# Patient Record
Sex: Female | Born: 1980 | Race: White | Hispanic: No | Marital: Married
Health system: Southern US, Community
[De-identification: ages and names within clinical notes are randomized; demographics above are authoritative.]

## PROBLEM LIST (undated history)

## (undated) DIAGNOSIS — N289 Disorder of kidney and ureter, unspecified: Secondary | ICD-10-CM

## (undated) DIAGNOSIS — E7800 Pure hypercholesterolemia, unspecified: Secondary | ICD-10-CM

## (undated) DIAGNOSIS — I10 Essential (primary) hypertension: Secondary | ICD-10-CM

## (undated) DIAGNOSIS — G43909 Migraine, unspecified, not intractable, without status migrainosus: Secondary | ICD-10-CM

## (undated) DIAGNOSIS — E039 Hypothyroidism, unspecified: Secondary | ICD-10-CM

## (undated) DIAGNOSIS — J45909 Unspecified asthma, uncomplicated: Secondary | ICD-10-CM

## (undated) DIAGNOSIS — J40 Bronchitis, not specified as acute or chronic: Secondary | ICD-10-CM

## (undated) DIAGNOSIS — F319 Bipolar disorder, unspecified: Secondary | ICD-10-CM

## (undated) DIAGNOSIS — G40909 Epilepsy, unspecified, not intractable, without status epilepticus: Secondary | ICD-10-CM

## (undated) HISTORY — PX: CHOLECYSTECTOMY: SHX55

## (undated) HISTORY — PX: NEPHRECTOMY: SHX65

## (undated) HISTORY — PX: ABDOMINAL HYSTERECTOMY: SHX81

---

## 2005-03-21 ENCOUNTER — Emergency Department (HOSPITAL_COMMUNITY): Admission: EM | Admit: 2005-03-21 | Discharge: 2005-03-21 | Payer: Self-pay | Admitting: Family Medicine

## 2005-03-21 ENCOUNTER — Ambulatory Visit (HOSPITAL_COMMUNITY): Admission: RE | Admit: 2005-03-21 | Discharge: 2005-03-21 | Payer: Self-pay | Admitting: Family Medicine

## 2008-07-26 ENCOUNTER — Emergency Department (HOSPITAL_COMMUNITY): Admission: EM | Admit: 2008-07-26 | Discharge: 2008-07-26 | Payer: Self-pay | Admitting: Family Medicine

## 2017-07-09 DIAGNOSIS — Z79899 Other long term (current) drug therapy: Secondary | ICD-10-CM | POA: Insufficient documentation

## 2017-07-09 DIAGNOSIS — G8929 Other chronic pain: Secondary | ICD-10-CM | POA: Insufficient documentation

## 2017-07-09 DIAGNOSIS — F319 Bipolar disorder, unspecified: Secondary | ICD-10-CM | POA: Insufficient documentation

## 2018-05-25 ENCOUNTER — Emergency Department
Admission: EM | Admit: 2018-05-25 | Discharge: 2018-05-25 | Disposition: A | Discharge status: Home / Self Care | Payer: 59 | Attending: Emergency Medicine | Admitting: Emergency Medicine

## 2018-05-25 ENCOUNTER — Other Ambulatory Visit: Payer: Self-pay

## 2018-05-25 ENCOUNTER — Encounter: Payer: Self-pay | Admitting: Emergency Medicine

## 2018-05-25 DIAGNOSIS — R21 Rash and other nonspecific skin eruption: Secondary | ICD-10-CM | POA: Diagnosis present

## 2018-05-25 DIAGNOSIS — Y929 Unspecified place or not applicable: Secondary | ICD-10-CM | POA: Diagnosis not present

## 2018-05-25 DIAGNOSIS — S40862A Insect bite (nonvenomous) of left upper arm, initial encounter: Secondary | ICD-10-CM | POA: Diagnosis not present

## 2018-05-25 DIAGNOSIS — Y998 Other external cause status: Secondary | ICD-10-CM | POA: Diagnosis not present

## 2018-05-25 DIAGNOSIS — Y939 Activity, unspecified: Secondary | ICD-10-CM | POA: Insufficient documentation

## 2018-05-25 DIAGNOSIS — S40861A Insect bite (nonvenomous) of right upper arm, initial encounter: Secondary | ICD-10-CM | POA: Diagnosis not present

## 2018-05-25 DIAGNOSIS — W57XXXA Bitten or stung by nonvenomous insect and other nonvenomous arthropods, initial encounter: Secondary | ICD-10-CM | POA: Diagnosis not present

## 2018-05-25 MED ORDER — DIPHENHYDRAMINE HCL 25 MG PO CAPS: 25.0000 mg | ORAL_CAPSULE | Freq: Once | ORAL | Status: DC

## 2018-05-25 MED ORDER — FAMOTIDINE 20 MG PO TABS
40.0000 mg | ORAL_TABLET | Freq: Once | ORAL | Status: AC
  Administered 2018-05-25: 40 mg via ORAL
  Filled 2018-05-25: qty 2

## 2018-05-25 NOTE — ED Triage Notes (Signed)
Pt to ED via POV c/o rash on both arms since Thursday afternoon. Pt states that the rash itches. Pt is in NAD at this time.

## 2018-05-25 NOTE — Discharge Instructions (Addendum)
Your rash appears to be bites from bed bugs. Take OTC Benadryl and Zantac or Pepcid, for itch relief. Check you mattress for signs of infestation. You must use products specifically for bedbugs to rid your home and furniture of insects.

## 2018-05-26 NOTE — ED Provider Notes (Signed)
[facility] Emergency Department Provider Note ____________________________________________  Time seen: 1945  I have reviewed the triage vital signs and the nursing notes.  HISTORY  Chief Complaint  Rash  HPI Brianna [name] is a 37 y.o. female resents to the ED accompanied by her fianc, for evaluation of itchy rash noted to the forearms.  Patient is unaware of any known exposures or contacts.  She does admit that she recently moved in with her boyfriend and has been seen in 1 of the bedrooms.  Patient does have pets that are in the home but denies any flea infestation.  Patient denies any known insect bites.  She presents with multiple small red bumps along the upper arms bilaterally.  There also similar bumps noted around the waist.  Patient's fianc reports changing the sheets regularly and also spraying the sheets and mattress with bug spray regularly.  History reviewed. No pertinent past medical history.  There are no active problems to display for this patient.   Past Surgical History:  Procedure Laterality Date  . ABDOMINAL HYSTERECTOMY    . NEPHRECTOMY Left     Prior to Admission medications   Not on File    Allergies Patient has no known allergies.  No family history on file.  Social History Social History   Tobacco Use  . Smoking status: Never Smoker  . Smokeless tobacco: Never Used  Substance Use Topics  . Alcohol use: Not Currently  . Drug use: Not Currently    Review of Systems  Constitutional: Negative for fever. Eyes: Negative for visual changes. ENT: Negative for sore throat. Cardiovascular: Negative for chest pain. Respiratory: Negative for shortness of breath. Skin: Positive for rash. Neurological: Negative for headaches, focal weakness or numbness. ____________________________________________  PHYSICAL EXAM:  VITAL SIGNS: ED Triage Vitals  Enc Vitals Group     BP 05/25/18 1755 110/66     Pulse Rate 05/25/18 1755 81      Resp 05/25/18 1755 16     Temp 05/25/18 1755 98.4 F (36.9 C)     Temp Source 05/25/18 1755 Oral     SpO2 05/25/18 1755 99 %     Weight 05/25/18 1754 194 lb (88 kg)     Height 05/25/18 1754 5' (1.524 m)     Head Circumference --      Peak Flow --      Pain Score 05/25/18 1754 0     Pain Loc --      Pain Edu? --      Excl. in GC? --     Constitutional: Alert and oriented. Well appearing and in no distress. Head: Normocephalic and atraumatic. Eyes: Conjunctivae are normal. Normal extraocular movements Ears: Canals clear. TMs intact bilaterally. Nose: No congestion/rhinorrhea/epistaxis. Cardiovascular: Normal rate, regular rhythm. Normal distal pulses. Respiratory: Normal respiratory effort. Musculoskeletal: Nontender with normal range of motion in all extremities.  Neurologic:  Normal gait without ataxia. Normal speech and language. No gross focal neurologic deficits are appreciated. Skin:  Skin is warm, dry and intact.  Patient with multiple small erythematous papules noted along the forearm.  The lesions appear to be skin somewhat linear fashion along the upper arms.  There is no central punctum or pustule noted.  There is no excoriation noted and there is no blister formation. ____________________________________________  PROCEDURES  Procedures Famotidine 40 mg PO ____________________________________________  INITIAL IMPRESSION / ASSESSMENT AND PLAN / ED COURSE  Patient with ED evaluation of a sudden onset of itchy pruritic rash since  Thursday.  Patient has recently moved in with her fianc into his home.  The rash appears consistent with a likely bed infestation and exposure.  Patient's rash appears to be pruritic erythematous papules along the upper arms consistent with a bug bites.  Patient is advised she may take antihistamines to help with itch relief.  Patient is currently driving so she will not be offered a first generation antihistamine at this visit.  She and her  fianc given instructions on how to investigate and manage bedbugs if they are found.  Patient will follow-up with primary provider return to the ED as needed. ____________________________________________  FINAL CLINICAL IMPRESSION(S) / ED DIAGNOSES  Final diagnoses:  Bedbug bite, initial encounter      Lissa Hoard, PA-C 05/26/18 2329    Minna Antis, MD 05/28/18 1352

## 2019-06-12 DIAGNOSIS — J45991 Cough variant asthma: Secondary | ICD-10-CM | POA: Insufficient documentation

## 2020-04-28 DIAGNOSIS — J4520 Mild intermittent asthma, uncomplicated: Secondary | ICD-10-CM | POA: Insufficient documentation

## 2020-09-14 DIAGNOSIS — R569 Unspecified convulsions: Secondary | ICD-10-CM | POA: Insufficient documentation

## 2020-09-14 DIAGNOSIS — G43909 Migraine, unspecified, not intractable, without status migrainosus: Secondary | ICD-10-CM | POA: Insufficient documentation

## 2020-09-14 DIAGNOSIS — K580 Irritable bowel syndrome with diarrhea: Secondary | ICD-10-CM | POA: Insufficient documentation

## 2020-09-14 DIAGNOSIS — Z905 Acquired absence of kidney: Secondary | ICD-10-CM | POA: Insufficient documentation

## 2020-12-08 ENCOUNTER — Encounter: Payer: Self-pay | Admitting: Emergency Medicine

## 2020-12-08 ENCOUNTER — Other Ambulatory Visit: Payer: Self-pay

## 2020-12-08 ENCOUNTER — Emergency Department
Admission: EM | Admit: 2020-12-08 | Discharge: 2020-12-08 | Disposition: A | Discharge status: Home / Self Care | Payer: 59 | Attending: Emergency Medicine | Admitting: Emergency Medicine

## 2020-12-08 DIAGNOSIS — J069 Acute upper respiratory infection, unspecified: Secondary | ICD-10-CM | POA: Insufficient documentation

## 2020-12-08 DIAGNOSIS — R059 Cough, unspecified: Secondary | ICD-10-CM | POA: Diagnosis present

## 2020-12-08 MED ORDER — PREDNISONE 50 MG PO TABS: 50.0000 mg | ORAL_TABLET | Freq: Every day | ORAL | 0 refills | Status: DC

## 2020-12-08 NOTE — ED Provider Notes (Signed)
[facility] Emergency Department Provider Note   ____________________________________________    I have reviewed the triage vital signs and the nursing notes.   HISTORY  Chief Complaint Cough     HPI Brianna [name] is a 40 y.o. female who presents with complaints of 3 days of a cough.  Patient reports she is occasionally felt wheezy as well.  Denies fevers or chills.  No shortness of breath.  No nausea vomiting or diaphoresis.  Has been vaccinated against COVID-19.  No Covid exposures reported.  History reviewed. No pertinent past medical history.  There are no problems to display for this patient.   Past Surgical History:  Procedure Laterality Date  . ABDOMINAL HYSTERECTOMY    . NEPHRECTOMY Left     Prior to Admission medications   Medication Sig Start Date End Date Taking? Authorizing Provider  predniSONE (DELTASONE) 50 MG tablet Take 1 tablet (50 mg total) by mouth daily with breakfast. 12/08/20  Yes Jene Every, MD     Allergies Hydrocodone  History reviewed. No pertinent family history.  Social History Social History   Tobacco Use  . Smoking status: Never Smoker  . Smokeless tobacco: Never Used  Substance Use Topics  . Alcohol use: Not Currently  . Drug use: Not Currently    Review of Systems  Constitutional: No fever/chills  ENT: No sore throat. Respiratory: Cough as above  Gastrointestinal: No abdominal pain.  No nausea, no vomiting.    Musculoskeletal: Negative for myalgias Skin: Negative for rash. Neurological: Negative for headaches     ____________________________________________   PHYSICAL EXAM:  VITAL SIGNS: ED Triage Vitals [12/08/20 1102]  Enc Vitals Group     BP 131/65     Pulse Rate 93     Resp 18     Temp 99.2 F (37.3 C)     Temp Source Oral     SpO2 100 %     Weight 71.2 kg (157 lb)     Height 1.499 m (4\' 11" )     Head Circumference      Peak Flow      Pain Score 0     Pain Loc       Pain Edu?      Excl. in GC?     Constitutional: Alert and oriented. No acute distress. Pleasant and interactive Eyes: Conjunctivae are normal.   Nose: No congestion/rhinnorhea. Mouth/Throat: Mucous membranes are moist.   Cardiovascular: Normal rate, regular rhythm.  Respiratory: Normal respiratory effort.  No retractions.  Clear to auscultation bilaterally  Neurologic:  Normal speech and language. No gross focal neurologic deficits are appreciated.   Skin:  Skin is warm, dry and intact. No rash noted.   ____________________________________________   LABS (all labs ordered are listed, but only abnormal results are displayed)  Labs Reviewed - No data to display ____________________________________________  EKG   ____________________________________________  RADIOLOGY   ____________________________________________   PROCEDURES  Procedure(s) performedNo  Procedures   Critical Care performed: No ____________________________________________   INITIAL IMPRESSION / ASSESSMENT AND PLAN / ED COURSE  Pertinent labs & imaging results that were available during my care of the patient were reviewed by me and considered in my medical decision making (see chart for details).  Patient well-appearing and in no acute distress, vital signs reassuring, exam is benign.  Clear lungs.  Suspect viral upper respiratory infection versus bronchitis, treatment is primarily supportive care, will give bolus of prednisone as well for symptom relief.   ____________________________________________  FINAL CLINICAL IMPRESSION(S) / ED DIAGNOSES  Final diagnoses:  Viral URI with cough      NEW MEDICATIONS STARTED DURING THIS VISIT:  Discharge Medication List as of 12/08/2020 12:05 PM    START taking these medications   Details  predniSONE (DELTASONE) 50 MG tablet Take 1 tablet (50 mg total) by mouth daily with breakfast., Starting Wed 12/08/2020, Normal         Note:  This  document was prepared using Dragon voice recognition software and may include unintentional dictation errors.   Jene Every, MD 12/08/20 [phone]

## 2020-12-08 NOTE — ED Triage Notes (Signed)
Pt comes pov with a cough. Thinks she has bronchitis.

## 2021-03-03 DIAGNOSIS — F411 Generalized anxiety disorder: Secondary | ICD-10-CM | POA: Insufficient documentation

## 2021-05-05 ENCOUNTER — Other Ambulatory Visit: Payer: Self-pay | Admitting: Nurse Practitioner

## 2021-05-05 DIAGNOSIS — M25571 Pain in right ankle and joints of right foot: Secondary | ICD-10-CM

## 2021-08-08 ENCOUNTER — Other Ambulatory Visit: Payer: Self-pay

## 2021-08-08 ENCOUNTER — Emergency Department
Admission: EM | Admit: 2021-08-08 | Discharge: 2021-08-08 | Disposition: A | Discharge status: Home / Self Care | Payer: 59 | Attending: Emergency Medicine | Admitting: Emergency Medicine

## 2021-08-08 DIAGNOSIS — L02214 Cutaneous abscess of groin: Secondary | ICD-10-CM | POA: Diagnosis present

## 2021-08-08 DIAGNOSIS — L0291 Cutaneous abscess, unspecified: Secondary | ICD-10-CM

## 2021-08-08 MED ORDER — CEPHALEXIN 500 MG PO CAPS: 500.0000 mg | ORAL_CAPSULE | Freq: Three times a day (TID) | ORAL | 0 refills | Status: DC

## 2021-08-08 NOTE — Discharge Instructions (Addendum)
Follow-up with your regular doctor if not improving in 2 to 3 days.  Return emergency department worsening. 

## 2021-08-08 NOTE — ED Provider Notes (Signed)
[facility] Emergency Department Provider Note  ____________________________________________   Event Date/Time   First MD Initiated Contact with Patient 08/08/21 1038     (approximate)  I have reviewed the triage vital signs and the nursing notes.   HISTORY  Chief Complaint Abscess    HPI Porschia Willbanks is a 40 y.o. female presents emergency department complaining of a possible abscess to the right groin area.  Patient states she noticed that this morning and her husband looked at it told her it looked red with some green stuff on it.  No fever or chills.  No noted drainage.  History of the same in the past.  History reviewed. No pertinent past medical history.  There are no problems to display for this patient.   Past Surgical History:  Procedure Laterality Date   CHOLECYSTECTOMY      Prior to Admission medications   Medication Sig Start Date End Date Taking? Authorizing Provider  cephALEXin (KEFLEX) 500 MG capsule Take 1 capsule (500 mg total) by mouth 3 (three) times daily. 08/08/21  Yes Jadie Allington, Roselyn Bering, PA-C  fluticasone furoate-vilanterol (BREO ELLIPTA) 200-25 MCG/INH AEPB Inhale 1 puff into the lungs daily.   Yes [provider]  gabapentin (NEURONTIN) 100 MG capsule Take 100 mg by mouth 3 (three) times daily.   Yes [provider]  levothyroxine (SYNTHROID) 25 MCG tablet Take 25 mcg by mouth daily before breakfast.   Yes [provider]  Ubrogepant (UBRELVY) 100 MG TABS Take by mouth.   Yes [provider]    Allergies Hydrocodone and Neosporin [bacitracin-polymyxin b]  No family history on file.  Social History Social History   Substance Use Topics   Alcohol use: Not Currently   Drug use: Not Currently    Review of Systems  Constitutional: No fever/chills Eyes: No visual changes. ENT: No sore throat. Respiratory: Denies cough Genitourinary: Negative for dysuria. Musculoskeletal: Negative for  back pain. Skin: Negative for rash. Psychiatric: no mood changes,     ____________________________________________   PHYSICAL EXAM:  VITAL SIGNS: ED Triage Vitals [08/08/21 1010]  Enc Vitals Group     BP 117/87     Pulse Rate 70     Resp 18     Temp 97.8 F (36.6 C)     Temp Source Oral     SpO2 100 %     Weight 185 lb (83.9 kg)     Height 4\' 11"  (1.499 m)     Head Circumference      Peak Flow      Pain Score 5     Pain Loc      Pain Edu?      Excl. in GC?     Constitutional: Alert and oriented. Well appearing and in no acute distress. Eyes: Conjunctivae are normal.  Head: Atraumatic. Nose: No congestion/rhinnorhea. Mouth/Throat: Mucous membranes are moist.   Neck:  supple no lymphadenopathy noted Cardiovascular: Normal rate, regular rhythm.  Respiratory: Normal respiratory effort.  No retractions, GU: deferred Musculoskeletal: FROM all extremities, warm and well perfused Neurologic:  Normal speech and language.  Skin:  Skin is warm, dry and intact. No rash noted.  Positive raised pink area with no drainage, area is hard and indurated surrounding the abscess, no drainage is noted Psychiatric: Mood and affect are normal. Speech and behavior are normal.  ____________________________________________   LABS (all labs ordered are listed, but only abnormal results are displayed)  Labs Reviewed - No data to display  ____________________________________________   ____________________________________________  RADIOLOGY    ____________________________________________   PROCEDURES  Procedure(s) performed: No  Procedures    ____________________________________________   INITIAL IMPRESSION / ASSESSMENT AND PLAN / ED COURSE  Pertinent labs & imaging results that were available during my care of the patient were reviewed by me and considered in my medical decision making (see chart for details).   Patient's 40 year old female presents with abscess to the  right groin.  She HPI.  Physical exam is consistent with same.  Do not feel that the patient needs any lab work.  She appears to be stable and the area is very minor.  She is given a prescription for Keflex.  Instructions to apply warm compress.  Follow-up with her regular doctor if not improved in 3 days.  Return emergency department worsening.  She is discharged stable condition.     Nonie Lochner was evaluated in Emergency Department on 08/08/2021 for the symptoms described in the history of present illness. She was evaluated in the context of the global COVID-19 pandemic, which necessitated consideration that the patient might be at risk for infection with the SARS-CoV-2 virus that causes COVID-19. Institutional protocols and algorithms that pertain to the evaluation of patients at risk for COVID-19 are in a state of rapid change based on information released by regulatory bodies including the CDC and federal and state organizations. These policies and algorithms were followed during the patient's care in the ED.    As part of my medical decision making, I reviewed the following data within the electronic MEDICAL RECORD NUMBER Nursing notes reviewed and incorporated, Old chart reviewed, Notes from prior ED visits, and [place] Controlled Substance Database  ____________________________________________   FINAL CLINICAL IMPRESSION(S) / ED DIAGNOSES  Final diagnoses:  Abscess      NEW MEDICATIONS STARTED DURING THIS VISIT:  New Prescriptions   CEPHALEXIN (KEFLEX) 500 MG CAPSULE    Take 1 capsule (500 mg total) by mouth 3 (three) times daily.     Note:  This document was prepared using Dragon voice recognition software and may include unintentional dictation errors.    Faythe Ghee, PA-C 08/08/21 1045    Georga Hacking, MD 08/08/21 1556

## 2021-08-08 NOTE — ED Notes (Signed)
See triage note  Presents with possible abscess area to right groin   States she noticed soreness and a raised area couple of days ago

## 2021-08-08 NOTE — ED Triage Notes (Signed)
Pt to ED for abscess to right groin for the past few days. Reports hx of having these all her life and them healing on their own but her husband looked at it and said it looks red.  Pt in NAD

## 2021-08-09 ENCOUNTER — Encounter: Payer: Self-pay | Admitting: Emergency Medicine

## 2021-08-22 ENCOUNTER — Other Ambulatory Visit: Payer: Self-pay | Admitting: Nurse Practitioner

## 2021-08-22 DIAGNOSIS — Z1231 Encounter for screening mammogram for malignant neoplasm of breast: Secondary | ICD-10-CM

## 2021-08-24 ENCOUNTER — Other Ambulatory Visit: Payer: Self-pay | Admitting: *Deleted

## 2021-08-24 ENCOUNTER — Inpatient Hospital Stay
Admission: RE | Admit: 2021-08-24 | Discharge: 2021-08-24 | Disposition: A | Discharge status: Home / Self Care | Payer: Self-pay | Source: Ambulatory Visit | Attending: *Deleted | Admitting: *Deleted

## 2021-08-24 DIAGNOSIS — Z1231 Encounter for screening mammogram for malignant neoplasm of breast: Secondary | ICD-10-CM

## 2021-09-09 ENCOUNTER — Ambulatory Visit
Admission: RE | Admit: 2021-09-09 | Discharge: 2021-09-09 | Disposition: A | Discharge status: Home / Self Care | Payer: 59 | Source: Ambulatory Visit | Attending: Nurse Practitioner | Admitting: Nurse Practitioner

## 2021-09-09 ENCOUNTER — Other Ambulatory Visit: Payer: Self-pay

## 2021-09-09 DIAGNOSIS — Z1231 Encounter for screening mammogram for malignant neoplasm of breast: Secondary | ICD-10-CM | POA: Diagnosis not present

## 2021-09-13 ENCOUNTER — Emergency Department
Admission: EM | Admit: 2021-09-13 | Discharge: 2021-09-13 | Disposition: A | Discharge status: Home / Self Care | Payer: 59 | Attending: Emergency Medicine | Admitting: Emergency Medicine

## 2021-09-13 ENCOUNTER — Other Ambulatory Visit: Payer: Self-pay

## 2021-09-13 ENCOUNTER — Encounter: Payer: Self-pay | Admitting: Emergency Medicine

## 2021-09-13 DIAGNOSIS — L03116 Cellulitis of left lower limb: Secondary | ICD-10-CM

## 2021-09-13 DIAGNOSIS — R2242 Localized swelling, mass and lump, left lower limb: Secondary | ICD-10-CM | POA: Diagnosis present

## 2021-09-13 MED ORDER — TRAMADOL HCL 50 MG PO TABS: 50.0000 mg | ORAL_TABLET | Freq: Four times a day (QID) | ORAL | 0 refills | Status: DC | PRN

## 2021-09-13 MED ORDER — SULFAMETHOXAZOLE-TRIMETHOPRIM 800-160 MG PO TABS: 1.0000 | ORAL_TABLET | Freq: Two times a day (BID) | ORAL | 0 refills | Status: DC

## 2021-09-13 NOTE — ED Provider Notes (Signed)
[facility] Emergency Department Provider Note  ____________________________________________  Time seen: Approximately 7:43 AM  I have reviewed the triage vital signs and the nursing notes.   HISTORY  Chief Complaint Abscess   HPI Brianna [name] is a 40 y.o. female presents to the emergency department for treatment and evaluation of abscess to the left inner thigh.  She has been on antibiotic and has used some type of cream.  Finished antibiotic 2 days ago.  Pain continues to be present.  No fever.  History reviewed. No pertinent past medical history.  There are no problems to display for this patient.   Past Surgical History:  Procedure Laterality Date   ABDOMINAL HYSTERECTOMY     CHOLECYSTECTOMY     NEPHRECTOMY Left     Prior to Admission medications   Medication Sig Start Date End Date Taking? Authorizing Provider  sulfamethoxazole-trimethoprim (BACTRIM DS) 800-160 MG tablet Take 1 tablet by mouth 2 (two) times daily. 09/13/21  Yes Jeraldean Wechter B, FNP  traMADol (ULTRAM) 50 MG tablet Take 1 tablet (50 mg total) by mouth every 6 ([name]) hours as needed. 09/13/21  Yes Demitrus Francisco B, FNP  fluticasone furoate-vilanterol (BREO ELLIPTA) 200-25 MCG/INH AEPB Inhale 1 puff into the lungs daily.    [provider]  gabapentin (NEURONTIN) 100 MG capsule Take 100 mg by mouth 3 (three) times daily.    [provider]  levothyroxine (SYNTHROID) 25 MCG tablet Take 25 mcg by mouth daily before breakfast.    [provider]  Ubrogepant (UBRELVY) 100 MG TABS Take by mouth.    [provider]    Allergies Hydrocodone, Hydrocodone, and Neosporin [bacitracin-polymyxin b]  No family history on file.  Social History Social History   Tobacco Use   Smoking status: Never   Smokeless tobacco: Never  Vaping Use   Vaping Use: Never used  Substance Use Topics   Alcohol use: Not Currently   Drug use: Not Currently    Review of  Systems  Constitutional: Negative for fever. Respiratory: Negative for cough or shortness of breath.  Musculoskeletal: Negative for myalgias Skin: Positive for abscess. Neurological: Negative for numbness or paresthesias. ____________________________________________   PHYSICAL EXAM:  VITAL SIGNS: ED Triage Vitals  Enc Vitals Group     BP 09/13/21 0547 113/79     Pulse Rate 09/13/21 0547 67     Resp 09/13/21 0547 18     Temp 09/13/21 0547 98.4 F (36.9 C)     Temp Source 09/13/21 0547 Oral     SpO2 09/13/21 0547 97 %     Weight 09/13/21 0546 183 lb (83 kg)     Height 09/13/21 0546 4\' 11"  (1.499 m)     Head Circumference --      Peak Flow --      Pain Score 09/13/21 0546 10     Pain Loc --      Pain Edu? --      Excl. in GC? --      Constitutional: Well appearing. Eyes: Conjunctivae are clear without discharge or drainage. Nose: No rhinorrhea noted. Mouth/Throat: Airway is patent.  Neck: No stridor. Unrestricted range of motion observed. Cardiovascular: Capillary refill is <3 seconds.  Respiratory: Respirations are even and unlabored.. Musculoskeletal: Unrestricted range of motion observed. Neurologic: Awake, alert, and oriented x 4.  Skin:  No focal abscess. Erythema in skin fold of left groin and inner thigh.  ____________________________________________   LABS (all labs ordered are listed, but only abnormal  results are displayed)  Labs Reviewed - No data to display ____________________________________________  EKG  Not indicated. ____________________________________________  RADIOLOGY  Not indicated. ____________________________________________   PROCEDURES  Procedures ____________________________________________   INITIAL IMPRESSION / ASSESSMENT AND PLAN / ED COURSE  Brianna [name] is a 40 y.o. female presents to the ER for treatment and evaluation of pain to the left inner thigh. She had thought there was an abscess in the area. No focal  erythema or area of fluctuance on exam. She does have some erythema in the skin fold left groin and inner thigh which could be an early cellulitis. Will treat with Bactrim. She was encouraged to take it until finished. She is to follow up with her primary care provider if not improving over the next 2-3 days. If symptoms change or worsen and unable to schedule an appointment, she is to return to the ER.   Medications - No data to display   Pertinent labs & imaging results that were available during my care of the patient were reviewed by me and considered in my medical decision making (see chart for details).  ____________________________________________   FINAL CLINICAL IMPRESSION(S) / ED DIAGNOSES  Final diagnoses:  Cellulitis of left lower extremity    ED Discharge Orders          Ordered    sulfamethoxazole-trimethoprim (BACTRIM DS) 800-160 MG tablet  2 times daily        09/13/21 0754    traMADol (ULTRAM) 50 MG tablet  Every 6 hours PRN        09/13/21 0754             Note:  This document was prepared using Dragon voice recognition software and may include unintentional dictation errors.   Chinita Pester, FNP 09/13/21 3474    Georga Hacking, MD 09/22/21 [phone]

## 2021-09-13 NOTE — ED Notes (Signed)
See triage note  presents with possible abscess area to inner left thigh   area has been there for a few days

## 2021-09-13 NOTE — Discharge Instructions (Signed)
Take the antibiotic until finished. Follow up with primary care if not improving over the next 2-3 days. Return to the ER for symptoms that change or worsen if unable to schedule an appointment.

## 2021-09-13 NOTE — ED Triage Notes (Signed)
Patient ambulatory to triage with steady gait, without difficulty or distress noted; pt reports abscess to left thigh x 2wks; st hx of same

## 2021-09-15 ENCOUNTER — Ambulatory Visit: Payer: 59 | Admitting: Neurology

## 2021-09-19 ENCOUNTER — Other Ambulatory Visit: Payer: Self-pay

## 2021-09-19 ENCOUNTER — Ambulatory Visit (INDEPENDENT_AMBULATORY_CARE_PROVIDER_SITE_OTHER): Payer: 59 | Admitting: Gastroenterology

## 2021-09-19 ENCOUNTER — Encounter: Payer: Self-pay | Admitting: Gastroenterology

## 2021-09-19 VITALS — BP 104/71 | HR 65 | Temp 99.1°F | Ht 59.0 in | Wt 183.0 lb

## 2021-09-19 DIAGNOSIS — K625 Hemorrhage of anus and rectum: Secondary | ICD-10-CM | POA: Diagnosis not present

## 2021-09-19 MED ORDER — NA SULFATE-K SULFATE-MG SULF 17.5-3.13-1.6 GM/177ML PO SOLN: 354.0000 mL | Freq: Once | ORAL | 0 refills | Status: AC

## 2021-09-19 NOTE — Progress Notes (Signed)
Wyline Mood MD, MRCP(U.K) [address]  [address]  [place], [place] [address]  Main: [phone]  Fax: [phone]   Primary Care Physician: System, Provider Not In  Primary Gastroenterologist:  Dr. Wyline Mood   Chief Complaint  Patient presents with   Hemorrhage of anus and rectum    HPI: Brianna [name] is a 40 y.o. female  She states she is here today to see me for rectal bleeding.  Occurred all of a sudden a few weeks back.  Lasted a few days.  It was blood on the tissue paper.  Painless in nature.  Some perianal itching at times.  Denies any constipation.  Brianna [name] says she has runny stools often which is not ongoing at this point of time.  Denies any prior colonoscopy.  No family history of colon cancer or polyps.  No weight loss.  No change in the shape of her stool. She is not on any blood thinners. Current Outpatient Medications  Medication Sig Dispense Refill   acetaminophen (TYLENOL) 500 MG tablet Take by mouth.     escitalopram (LEXAPRO) 10 MG tablet Take 10 mg by mouth daily.     fluticasone furoate-vilanterol (BREO ELLIPTA) 200-25 MCG/INH AEPB Inhale 1 puff into the lungs daily.     gabapentin (NEURONTIN) 100 MG capsule Take 100 mg by mouth 3 (three) times daily.     levothyroxine (SYNTHROID) 25 MCG tablet Take 25 mcg by mouth daily before breakfast.     pantoprazole (PROTONIX) 40 MG tablet Take 40 mg by mouth daily.     sulfamethoxazole-trimethoprim (BACTRIM DS) 800-160 MG tablet Take 1 tablet by mouth 2 (two) times daily. 20 tablet 0   traMADol (ULTRAM) 50 MG tablet Take 1 tablet (50 mg total) by mouth every 6 (six) hours as needed. 12 tablet 0   Ubrogepant (UBRELVY) 100 MG TABS Take by mouth.     No current facility-administered medications for this visit.    Allergies as of 09/19/2021 - Review Complete 09/19/2021  Allergen Reaction Noted   Hydrocodone Nausea And Vomiting and Other (See Comments) 02/03/2016   Peanut oil Itching 02/03/2016   Hydrocodone  Other (See Comments) 08/08/2021   Neosporin [bacitracin-polymyxin b] Swelling 08/08/2021    ROS:  General: Negative for anorexia, weight loss, fever, chills, fatigue, weakness. ENT: Negative for hoarseness, difficulty swallowing , nasal congestion. CV: Negative for chest pain, angina, palpitations, dyspnea on exertion, peripheral edema.  Respiratory: Negative for dyspnea at rest, dyspnea on exertion, cough, sputum, wheezing.  GI: See history of present illness. GU:  Negative for dysuria, hematuria, urinary incontinence, urinary frequency, nocturnal urination.  Endo: Negative for unusual weight change.    Physical Examination:   BP 104/71   Pulse 65   Temp 99.1 F (37.3 C) (Oral)   Ht 4\' 11"  (1.499 m)   Wt 183 lb (83 kg)   BMI 36.96 kg/m   General: Well-nourished, well-developed in no acute distress.  Eyes: No icterus. Conjunctivae pink. Mouth: Oropharyngeal mucosa moist and pink , no lesions erythema or exudate. Lungs: Clear to auscultation bilaterally. Non-labored. Heart: Regular rate and rhythm, no murmurs rubs or gallops.  Abdomen: Bowel sounds are normal, nontender, nondistended, no hepatosplenomegaly or masses, no abdominal bruits or hernia , no rebound or guarding.   Extremities: No lower extremity edema. No clubbing or deformities. Neuro: Alert and oriented x 3.  Grossly intact. Skin: Warm and dry, no jaundice.   Psych: Alert and cooperative, normal mood and affect.   Imaging  Studies: MM 3D SCREEN BREAST BILATERAL  Result Date: 09/14/2021 CLINICAL DATA:  Screening. EXAM: DIGITAL SCREENING BILATERAL MAMMOGRAM WITH TOMOSYNTHESIS AND CAD TECHNIQUE: Bilateral screening digital craniocaudal and mediolateral oblique mammograms were obtained. Bilateral screening digital breast tomosynthesis was performed. The images were evaluated with computer-aided detection. COMPARISON:  Previous exam(s). ACR Breast Density Category b: There are scattered areas of fibroglandular density.  FINDINGS: There are no findings suspicious for malignancy. IMPRESSION: No mammographic evidence of malignancy. A result letter of this screening mammogram will be mailed directly to the patient. RECOMMENDATION: Screening mammogram in one year. (Code:SM-B-01Y) BI-RADS CATEGORY  1: Negative. Electronically Signed   By: Sherian Rein M.D.   On: 09/14/2021 10:47  MM Outside Films Mammo  Result Date: 08/24/2021 This examination belongs to an outside facility and is stored here for comparison purposes only.  Contact the originating outside institution for any associated report or interpretation.  MM Outside Films Mammo  Result Date: 08/24/2021 This examination belongs to an outside facility and is stored here for comparison purposes only.  Contact the originating outside institution for any associated report or interpretation.   Assessment and Plan:   Brianna [name] is a 40 y.o. y/o female here to see me for rectal bleeding.  Explained the differential diagnosis ranging from hemorrhoids to polyps and colon cancer.  Plan 1.  Colonoscopy   I have discussed alternative options, risks & benefits,  which include, but are not limited to, bleeding, infection, perforation,respiratory complication & drug reaction.  The patient agrees with this plan & written consent will be obtained.     Dr Wyline Mood  MD,MRCP [facility]) Follow up in as needed

## 2021-10-04 ENCOUNTER — Other Ambulatory Visit: Payer: Self-pay

## 2021-10-04 ENCOUNTER — Telehealth: Payer: Self-pay | Admitting: Gastroenterology

## 2021-10-04 MED ORDER — NA SULFATE-K SULFATE-MG SULF 17.5-3.13-1.6 GM/177ML PO SOLN: 354.0000 mL | Freq: Once | ORAL | 0 refills | Status: AC

## 2021-10-04 MED ORDER — SUTAB 1479-225-188 MG PO TABS: ORAL_TABLET | ORAL | 0 refills | Status: DC

## 2021-10-04 NOTE — Telephone Encounter (Signed)
Prep was sent to patient's pharmacy. 

## 2021-10-04 NOTE — Telephone Encounter (Signed)
Pt. Said she made a mistake and took her prep this morning and vomited it up. She is requesting a new prep to be sent to her pharmacy.

## 2021-10-05 ENCOUNTER — Ambulatory Visit
Admission: RE | Admit: 2021-10-05 | Discharge: 2021-10-05 | Disposition: A | Discharge status: Home / Self Care | Payer: 59 | Attending: Gastroenterology | Admitting: Gastroenterology

## 2021-10-05 ENCOUNTER — Ambulatory Visit: Payer: 59 | Admitting: Anesthesiology

## 2021-10-05 ENCOUNTER — Encounter
Admission: RE | Disposition: A | Discharge status: Home / Self Care | Payer: Self-pay | Source: Home / Self Care | Attending: Gastroenterology

## 2021-10-05 DIAGNOSIS — Z79890 Hormone replacement therapy (postmenopausal): Secondary | ICD-10-CM | POA: Diagnosis not present

## 2021-10-05 DIAGNOSIS — K625 Hemorrhage of anus and rectum: Secondary | ICD-10-CM | POA: Insufficient documentation

## 2021-10-05 DIAGNOSIS — Z9049 Acquired absence of other specified parts of digestive tract: Secondary | ICD-10-CM | POA: Diagnosis not present

## 2021-10-05 DIAGNOSIS — Z7951 Long term (current) use of inhaled steroids: Secondary | ICD-10-CM | POA: Insufficient documentation

## 2021-10-05 DIAGNOSIS — K640 First degree hemorrhoids: Secondary | ICD-10-CM | POA: Insufficient documentation

## 2021-10-05 DIAGNOSIS — Z79899 Other long term (current) drug therapy: Secondary | ICD-10-CM | POA: Diagnosis not present

## 2021-10-05 DIAGNOSIS — Z885 Allergy status to narcotic agent status: Secondary | ICD-10-CM | POA: Diagnosis not present

## 2021-10-05 DIAGNOSIS — Z905 Acquired absence of kidney: Secondary | ICD-10-CM | POA: Diagnosis not present

## 2021-10-05 HISTORY — PX: COLONOSCOPY WITH PROPOFOL: SHX5780

## 2021-10-05 SURGERY — COLONOSCOPY WITH PROPOFOL
Anesthesia: General

## 2021-10-05 MED ORDER — SODIUM CHLORIDE 0.9 % IV SOLN
INTRAVENOUS | Status: DC
  Administered 2021-10-05: 20 mL/h via INTRAVENOUS

## 2021-10-05 MED ORDER — PROPOFOL 500 MG/50ML IV EMUL
INTRAVENOUS | Status: AC
  Filled 2021-10-05: qty 50

## 2021-10-05 MED ORDER — PROPOFOL 500 MG/50ML IV EMUL
INTRAVENOUS | Status: DC | PRN
  Administered 2021-10-05: 125 ug/kg/min via INTRAVENOUS

## 2021-10-05 MED ORDER — LIDOCAINE HCL (CARDIAC) PF 100 MG/5ML IV SOSY
PREFILLED_SYRINGE | INTRAVENOUS | Status: DC | PRN
Start: 2021-10-05 — End: 2021-10-05
  Administered 2021-10-05: 50 mg via INTRAVENOUS

## 2021-10-05 MED ORDER — PROPOFOL 10 MG/ML IV BOLUS
INTRAVENOUS | Status: DC | PRN
  Administered 2021-10-05: 100 mg via INTRAVENOUS

## 2021-10-05 NOTE — Anesthesia Postprocedure Evaluation (Signed)
Anesthesia Post Note  Patient: Ambyr Qadri [facility]  Procedure(s) Performed: COLONOSCOPY WITH PROPOFOL  Patient location during evaluation: Endoscopy Anesthesia Type: General Level of consciousness: awake and alert and oriented Pain management: pain level controlled Vital Signs Assessment: post-procedure vital signs reviewed and stable Respiratory status: spontaneous breathing, nonlabored ventilation and respiratory function stable Cardiovascular status: blood pressure returned to baseline and stable Postop Assessment: no signs of nausea or vomiting Anesthetic complications: no   No notable events documented.   Last Vitals:  Vitals:   10/05/21 1244 10/05/21 1254  BP: 110/80 110/81  Pulse:  (!) 55  Resp: 15 14  Temp:    SpO2:  98%    Last Pain:  Vitals:   10/05/21 1254  TempSrc:   PainSc: 0-No pain                 Iyannah Blake

## 2021-10-05 NOTE — Transfer of Care (Signed)
Immediate Anesthesia Transfer of Care Note  Patient: Brianna [name] [facility]  Procedure(s) Performed: COLONOSCOPY WITH PROPOFOL  Patient Location: PACU  Anesthesia Type:MAC  Level of Consciousness: awake and drowsy  Airway & Oxygen Therapy: Patient Spontanous Breathing and Patient connected to nasal cannula oxygen  Post-op Assessment: Report given to RN and Post -op Vital signs reviewed and stable  Post vital signs: stable  Last Vitals:  Vitals Value Taken Time  BP 98/60 10/05/21 1224  Temp 35.6 C 10/05/21 1224  Pulse 55 10/05/21 1224  Resp 18 10/05/21 1224  SpO2 93 % 10/05/21 1224    Last Pain:  Vitals:   10/05/21 1224  TempSrc: Temporal  PainSc: Asleep         Complications: No notable events documented.

## 2021-10-05 NOTE — Anesthesia Preprocedure Evaluation (Signed)
Anesthesia Evaluation  Patient identified by MRN, date of birth, ID band Patient awake    Reviewed: Allergy & Precautions, NPO status , Patient's Chart, lab work & pertinent test results  History of Anesthesia Complications Negative for: history of anesthetic complications  Airway Mallampati: III  TM Distance: >3 FB Neck ROM: Full    Dental  (+) Poor Dentition   Pulmonary asthma (mild intermittent) , neg sleep apnea,    breath sounds clear to auscultation- rhonchi (-) wheezing      Cardiovascular Exercise Tolerance: Good (-) hypertension(-) CAD, (-) Past MI, (-) Cardiac Stents and (-) CABG  Rhythm:Regular Rate:Normal - Systolic murmurs and - Diastolic murmurs    Neuro/Psych  Headaches, PSYCHIATRIC DISORDERS Anxiety Bipolar Disorder    GI/Hepatic negative GI ROS, Neg liver ROS,   Endo/Other  negative endocrine ROSneg diabetes  Renal/GU Renal disease (single kidney, 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)     Musculoskeletal negative musculoskeletal ROS (+)   Abdominal (+) + obese,   Peds  Hematology negative hematology ROS (+)   Anesthesia Other Findings   Reproductive/Obstetrics                             Anesthesia Physical Anesthesia Plan  ASA: 2  Anesthesia Plan: General   Post-op Pain Management:    Induction: Intravenous  PONV Risk Score and Plan: 2 and Propofol infusion  Airway Management Planned: Natural Airway  Additional Equipment:   Intra-op Plan:   Post-operative Plan:   Informed Consent: I have reviewed the patients History and Physical, chart, labs and discussed the procedure including the risks, benefits and alternatives for the proposed anesthesia with the patient or authorized representative who has indicated his/her understanding and acceptance.     Dental advisory given  Plan Discussed with: CRNA and Anesthesiologist  Anesthesia Plan  Comments:         Anesthesia Quick Evaluation

## 2021-10-05 NOTE — H&P (Signed)
Jonathon Bellows, MD [address], [place], [place], [place], [address] [address], [place], [place], [place], [address] Phone: [phone]  Fax: [phone]  Primary Care Physician:  System, Provider Not In   Pre-Procedure History & Physical: HPI:  Brianna [name] is a 40 y.o. female is here for an colonoscopy.   No past medical history on file.  Past Surgical History:  Procedure Laterality Date   ABDOMINAL HYSTERECTOMY     CHOLECYSTECTOMY     NEPHRECTOMY Left     Prior to Admission medications   Medication Sig Start Date End Date Taking? Authorizing Provider  acetaminophen (TYLENOL) 500 MG tablet Take by mouth.   Yes [provider]  escitalopram (LEXAPRO) 10 MG tablet Take 10 mg by mouth daily. 09/11/21  Yes [provider]  fluticasone furoate-vilanterol (BREO ELLIPTA) 200-25 MCG/INH AEPB Inhale 1 puff into the lungs daily.   Yes [provider]  gabapentin (NEURONTIN) 100 MG capsule Take 100 mg by mouth 3 (three) times daily.   Yes [provider]  levothyroxine (SYNTHROID) 25 MCG tablet Take 25 mcg by mouth daily before breakfast.   Yes [provider]  pantoprazole (PROTONIX) 40 MG tablet Take 40 mg by mouth daily. 08/02/21  Yes [provider]  Sodium Sulfate-Mag Sulfate-KCl (SUTAB) [phone] MG TABS At 5 PM take 12 tablets using the 8 oz cup provided in the kit drinking 5 cups of water and 5 hours before your procedure repeat the same process. 10/04/21  Yes Jonathon Bellows, MD  sulfamethoxazole-trimethoprim (BACTRIM DS) 800-160 MG tablet Take 1 tablet by mouth 2 (two) times daily. 09/13/21  Yes Triplett, Cari B, FNP  traMADol (ULTRAM) 50 MG tablet Take 1 tablet (50 mg total) by mouth every 6 (six) hours as needed. 09/13/21  Yes Triplett, Cari B, FNP  Ubrogepant (UBRELVY) 100 MG TABS Take by mouth.   Yes [provider]    Allergies as of 09/19/2021 - Review Complete 09/19/2021  Allergen Reaction Noted    Hydrocodone Nausea And Vomiting and Other (See Comments) 02/03/2016   Peanut oil Itching 02/03/2016   Hydrocodone Other (See Comments) 08/08/2021   Neosporin [bacitracin-polymyxin b] Swelling 08/08/2021    No family history on file.  Social History   Socioeconomic History   Marital status: Married    Spouse name: Not on file   Number of children: Not on file   Years of education: Not on file   Highest education level: Not on file  Occupational History   Not on file  Tobacco Use   Smoking status: Never   Smokeless tobacco: Never  Vaping Use   Vaping Use: Never used  Substance and Sexual Activity   Alcohol use: Not Currently   Drug use: Not Currently   Sexual activity: Not on file  Other Topics Concern   Not on file  Social History Narrative   ** Merged History Encounter **       Social Determinants of Health   Financial Resource Strain: Not on file  Food Insecurity: Not on file  Transportation Needs: Not on file  Physical Activity: Not on file  Stress: Not on file  Social Connections: Not on file  Intimate Partner Violence: Not on file    Review of Systems: See HPI, otherwise negative ROS  Physical Exam: BP 125/65   Pulse 65   Temp (!) 96.7 F (35.9 C) (Temporal)   Resp 20   Ht 4' 11"  (1.499 m)   Wt  83.5 kg   SpO2 100%   BMI 37.16 kg/m  General:   Alert,  pleasant and cooperative in NAD Head:  Normocephalic and atraumatic. Neck:  Supple; no masses or thyromegaly. Lungs:  Clear throughout to auscultation, normal respiratory effort.    Heart:  +S1, +S2, Regular rate and rhythm, No edema. Abdomen:  Soft, nontender and nondistended. Normal bowel sounds, without guarding, and without rebound.   Neurologic:  Alert and  oriented x4;  grossly normal neurologically.  Impression/Plan: Brianna [name] is here for an colonoscopy to be performed for  rectal bleeding   Risks, benefits, limitations, and alternatives regarding  colonoscopy have been reviewed with the  patient.  Questions have been answered.  All parties agreeable.   Jonathon Bellows, MD  10/05/2021, 11:57 AM

## 2021-10-05 NOTE — Op Note (Signed)
[facility] Gastroenterology Patient Name: Brianna [name] Procedure Date: 10/05/2021 12:01 PM MRN: [id number] Account #: [id number] Date of Birth: [birth year] Admit Type: Ambulatory Age: 40 Room: [facility] ENDO ROOM 3 Gender: Female Note Status: Finalized Instrument Name: Prentice Docker [id number] Procedure:             Colonoscopy Indications:           Rectal bleeding Providers:             Wyline Mood MD, MD Medicines:             Monitored Anesthesia Care Complications:         No immediate complications. Procedure:             Pre-Anesthesia Assessment:                        - Prior to the procedure, a History and Physical was                         performed, and patient medications, allergies and                         sensitivities were reviewed. The patient's tolerance                         of previous anesthesia was reviewed.                        - The risks and benefits of the procedure and the                         sedation options and risks were discussed with the                         patient. All questions were answered and informed                         consent was obtained.                        - ASA Grade Assessment: II - A patient with mild                         systemic disease.                        After obtaining informed consent, the colonoscope was                         passed under direct vision. Throughout the procedure,                         the patient's blood pressure, pulse, and oxygen                         saturations were monitored continuously. The                         Colonoscope was introduced through the anus and  advanced to the the cecum, identified by the                         appendiceal orifice. The colonoscopy was performed                         with ease. The patient tolerated the procedure well.                         The quality of the bowel preparation was  adequate. Findings:      The perianal and digital rectal examinations were normal.      Non-bleeding internal hemorrhoids were found during endoscopy. The       hemorrhoids were small and Grade I (internal hemorrhoids that do not       prolapse).      The exam was otherwise without abnormality on direct and retroflexion       views. Impression:            - Non-bleeding internal hemorrhoids.                        - The examination was otherwise normal on direct and                         retroflexion views.                        - No specimens collected. Recommendation:        - Discharge patient to home (with escort).                        - Resume previous diet.                        - Continue present medications.                        - Repeat colonoscopy in 5 years for screening purposes. Procedure Code(s):     --- Professional ---                        [phone], Colonoscopy, flexible; diagnostic, including                         collection of specimen(s) by brushing or washing, when                         performed (separate procedure) Diagnosis Code(s):     --- Professional ---                        K64.0, First degree hemorrhoids                        K62.5, Hemorrhage of anus and rectum CPT copyright 2019 [employer]. All rights reserved. The codes documented in this report are preliminary and upon coder review may  be revised to meet current compliance requirements. Wyline Mood, MD Wyline Mood MD, MD 10/05/2021 12:22:51 PM This report has been signed electronically. Number of Addenda: 0 Note Initiated On: 10/05/2021 12:01 PM Scope Withdrawal Time: 0 hours  11 minutes 30 seconds  Total Procedure Duration: 0 hours 14 minutes 38 seconds  Estimated Blood Loss:  Estimated blood loss: none.      [facility]

## 2021-10-06 ENCOUNTER — Encounter: Payer: Self-pay | Admitting: Gastroenterology

## 2021-10-10 ENCOUNTER — Emergency Department
Admission: EM | Admit: 2021-10-10 | Discharge: 2021-10-10 | Disposition: A | Discharge status: Home / Self Care | Payer: 59 | Attending: Emergency Medicine | Admitting: Emergency Medicine

## 2021-10-10 ENCOUNTER — Other Ambulatory Visit: Payer: Self-pay

## 2021-10-10 ENCOUNTER — Emergency Department: Payer: 59

## 2021-10-10 DIAGNOSIS — R0789 Other chest pain: Secondary | ICD-10-CM | POA: Diagnosis present

## 2021-10-10 DIAGNOSIS — M79652 Pain in left thigh: Secondary | ICD-10-CM | POA: Diagnosis not present

## 2021-10-10 LAB — CBC
HCT: 41.2 % (ref 36.0–46.0)
Hemoglobin: 14 g/dL (ref 12.0–15.0)
MCH: 31.3 pg (ref 26.0–34.0)
MCHC: 34 g/dL (ref 30.0–36.0)
MCV: 92.2 fL (ref 80.0–100.0)
Platelets: 228 10*3/uL (ref 150–400)
RBC: 4.47 MIL/uL (ref 3.87–5.11)
RDW: 13.1 % (ref 11.5–15.5)
WBC: 7.2 10*3/uL (ref 4.0–10.5)
nRBC: 0 % (ref 0.0–0.2)

## 2021-10-10 LAB — BASIC METABOLIC PANEL
Anion gap: 8 (ref 5–15)
BUN: 8 mg/dL (ref 6–20)
CO2: 26 mmol/L (ref 22–32)
Calcium: 8.9 mg/dL (ref 8.9–10.3)
Chloride: 104 mmol/L (ref 98–111)
Creatinine, Ser: 1.03 mg/dL — ABNORMAL HIGH (ref 0.44–1.00)
GFR, Estimated: >60 mL/min (ref >60)
Glucose, Bld: 95 mg/dL (ref 70–99)
Potassium: 4.2 mmol/L (ref 3.5–5.1)
Sodium: 138 mmol/L (ref 135–145)

## 2021-10-10 LAB — TROPONIN I (HIGH SENSITIVITY)
Troponin I (High Sensitivity): <2 ng/L (ref <18)
Troponin I (High Sensitivity): <2 ng/L (ref <18)

## 2021-10-10 NOTE — ED Provider Notes (Signed)
[facility] Emergency Department Provider Note ____________________________________________   Event Date/Time   First MD Initiated Contact with Patient 10/10/21 1618     (approximate)  I have reviewed the triage vital signs and the nursing notes.   HISTORY  Chief Complaint Chest Pain and Leg Pain    HPI Brianna [name] is a 40 y.o. female with PMH as noted below including seizure disorder, migraine, asthma, and anxiety who presents with chest pain since yesterday, intermittent, described as squeezing, substernal in location and nonradiating.  It is nonexertional.  She reports some associated shortness of breath but no nausea, weakness, or lightheadedness.  She denies any prior history of this pain.  She denies any specific elevated anxiety or stress recently.  The patient also reports some increased pain related to a "cyst" on her left inner thigh.  This has been present for several months and has intermittently flared up.  She has been on several antibiotic courses in the past.  She states that yesterday it was hurting more than it has been, although today it is somewhat better.  She denies any purulent drainage.  She has no pain or swelling throughout the rest of the leg.  She has no fever or chills.  History reviewed. No pertinent past medical history.  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 Authorizing Provider  acetaminophen (TYLENOL) 500 MG tablet Take by mouth.    [provider]  escitalopram (LEXAPRO) 10 MG tablet Take 10 mg by mouth daily. 09/11/21   [provider]  fluticasone furoate-vilanterol (BREO ELLIPTA) 200-25 MCG/INH AEPB Inhale 1 puff into the lungs daily.    [provider]  gabapentin (NEURONTIN) 100 MG capsule Take 100 mg by mouth 3 (three) times daily.    [provider]  levothyroxine (SYNTHROID) 25 MCG tablet Take 25 mcg by mouth daily before breakfast.    [provider]  pantoprazole (PROTONIX) 40 MG tablet Take 40 mg by mouth daily. 08/02/21   [provider]  Sodium Sulfate-Mag Sulfate-KCl (SUTAB) [phone] MG TABS At 5 PM take 12 tablets using the 8 oz cup provided in the kit drinking 5 cups of water and 5 hours before your procedure repeat the same process. 10/04/21   Jonathon Bellows, MD  sulfamethoxazole-trimethoprim (BACTRIM DS) 800-160 MG tablet Take 1 tablet by mouth 2 (two) times daily. 09/13/21   Triplett, Johnette Abraham B, FNP  traMADol (ULTRAM) 50 MG tablet Take 1 tablet (50 mg total) by mouth every 6 (six) hours as needed. 09/13/21   Triplett, Cari B, FNP  Ubrogepant (UBRELVY) 100 MG TABS Take by mouth.    [provider]  Allergies Hydrocodone, Peanut oil, Hydrocodone, and Neosporin [bacitracin-polymyxin b]  No family history on file.  Social History Social History   Tobacco Use   Smoking status: Never   Smokeless tobacco: Never  Vaping Use   Vaping Use: Never used  Substance Use Topics   Alcohol use: Not Currently   Drug use: Not Currently    Review of Systems  Constitutional: No  fever/chills Eyes: No visual changes. ENT: No sore throat. Cardiovascular: Positive for intermittent chest pain. Respiratory: Positive for intermittent shortness of breath. Gastrointestinal: No vomiting or diarrhea.  Genitourinary: Negative for dysuria.  Musculoskeletal: Negative for back pain. Skin: Negative for rash. Neurological: Negative for headaches, focal weakness or numbness.   ____________________________________________   PHYSICAL EXAM:  VITAL SIGNS: ED Triage Vitals  Enc Vitals Group     BP 10/10/21 1343 121/79     Pulse Rate 10/10/21 1343 73     Resp 10/10/21 1343 18     Temp 10/10/21 1343 98.4 F (36.9 C)     Temp Source 10/10/21 1343 Oral     SpO2 10/10/21 1343 96 %     Weight 10/10/21 1341 180 lb (81.6 kg)     Height 10/10/21 1341 4' 11"  (1.499 m)     Head Circumference --      Peak Flow --      Pain Score 10/10/21 1341 5     Pain Loc --      Pain Edu? --      Excl. in [place]? --     Constitutional: Alert and oriented. Well appearing and in no acute distress. Eyes: Conjunctivae are normal.  Head: Atraumatic. Nose: No congestion/rhinnorhea. Mouth/Throat: Mucous membranes are moist.   Neck: Normal range of motion.  Cardiovascular: Normal rate, regular rhythm. Grossly normal heart sounds.  Good peripheral circulation. Respiratory: Normal respiratory effort.  No retractions. Lungs CTAB. Gastrointestinal: No distention.  Musculoskeletal: No lower extremity edema.  Extremities warm and well perfused. Left proximal inner thigh with approximately 2cm healed pustule with mild tenderness.  No surrounding erythema, induration, abnormal warmth or fluctuance.  Neurologic:  Normal speech and language. No gross focal neurologic deficits are appreciated.  Skin:  Skin is warm and dry. No rash noted. Psychiatric: Mood and affect are normal. Speech and behavior are normal.  ____________________________________________   LABS (all labs ordered are listed, but only  abnormal results are displayed)  Labs Reviewed  BASIC METABOLIC PANEL - Abnormal; Notable for the following components:      Result Value   Creatinine, Ser 1.03 (*)    All other components within normal limits  CBC  POC URINE PREG, ED  TROPONIN I (HIGH SENSITIVITY)  TROPONIN I (HIGH SENSITIVITY)   ____________________________________________  EKG  ED ECG REPORT I, Arta Silence, the attending physician, personally viewed and interpreted this ECG.  Date: 10/10/2021 EKG Time: 1340 Rate: 74 Rhythm: normal sinus rhythm QRS Axis: normal Intervals: normal ST/T Wave abnormalities: nonspecific T-wave abnormalities  Narrative Interpretation: nonspecific abnormalities with no evidence of acute ischemia, interpretation limited by poor EKG baseline.  No prior EKG available for comparison.  ____________________________________________  RADIOLOGY  Chest X-ray interpreted by me shows no focal consolidation or edema  ____________________________________________   PROCEDURES  Procedure(s) performed: No  Procedures  Critical Care performed: No ____________________________________________   INITIAL IMPRESSION / ASSESSMENT AND PLAN / ED COURSE  Pertinent labs & imaging results that were available during my care of the patient were reviewed by me and considered in my medical decision  making (see chart for details).   40 year old female with PMH as noted above including seizure disorder, migraine, asthma and anxiety presents with atypical intermittent chest pain since yesterday along with somewhat increased pain from a chronic cyst or abscess to her left inner thigh.  On exam the patient is well-appearing and her vital signs are normal.  The physical exam is unremarkable except that the patient does have a slightly tender area to the left inner thigh which appears like a healed pustule without fluctuance, induration, or erythema.  I reviewed the past medical records in epic and it  appears that the patient was seen previously with a cellulitis there.  Chest pain: The patient is low risk for ACS.  There are no concerning EKG findings and her initial troponin is negative.  We will obtain a second troponin for ACS rule out.  Given the intermittent pain and lack of tachycardia or hypoxia there is no clinical evidence for PE, and the patient is PERC negative.  The area of pain to her left inner thigh is not consistent with a DVT.  There is no evidence of aortic dissection or other vascular cause.  I suspect GERD, musculoskeletal pain, or other benign etiology.  Left inner thigh lesion: This is consistent with a healed abscess.  There is no evidence of active cellulitis or an abscess requiring drainage.  The patient has been following with her PMD for this and I advised her to continue with current management.  ----------------------------------------- 6:32 PM on 10/10/2021 -----------------------------------------  Repeat troponin is negative.  The patient is asymptomatic.  She is comfortable and stable for discharge home.  I counseled her on the results of the work-up and gave her thorough return precautions; she expressed understanding.  She will follow-up with her PMD.  ____________________________________________   FINAL CLINICAL IMPRESSION(S) / ED DIAGNOSES  Final diagnoses:  Atypical chest pain      NEW MEDICATIONS STARTED DURING THIS VISIT:  New Prescriptions   No medications on file     Note:  This document was prepared using Dragon voice recognition software and may include unintentional dictation errors.    Arta Silence, MD 10/10/21 [phone]

## 2021-10-10 NOTE — Discharge Instructions (Signed)
Return to the ER for new, worsening, or persistent severe chest pain, difficulty breathing, weakness or lightheadedness, swelling or pain around the cyst or in the leg, or any other new or worsening symptoms that concern you.

## 2021-10-10 NOTE — ED Triage Notes (Signed)
Pt to ED for centralized chest pain that started yesterday. Denies shob, n/v Also reports abscess left thigh.  NAD noted

## 2021-10-17 ENCOUNTER — Ambulatory Visit (INDEPENDENT_AMBULATORY_CARE_PROVIDER_SITE_OTHER): Payer: 59 | Admitting: Neurology

## 2021-10-17 ENCOUNTER — Encounter: Payer: Self-pay | Admitting: Neurology

## 2021-10-17 VITALS — BP 116/72 | HR 70 | Ht 59.0 in | Wt 188.0 lb

## 2021-10-17 DIAGNOSIS — Z5181 Encounter for therapeutic drug level monitoring: Secondary | ICD-10-CM

## 2021-10-17 DIAGNOSIS — G40909 Epilepsy, unspecified, not intractable, without status epilepticus: Secondary | ICD-10-CM | POA: Diagnosis not present

## 2021-10-17 DIAGNOSIS — G43009 Migraine without aura, not intractable, without status migrainosus: Secondary | ICD-10-CM

## 2021-10-17 MED ORDER — AIMOVIG 140 MG/ML ~~LOC~~ SOAJ: 140.0000 mg | SUBCUTANEOUS | 3 refills | Status: DC

## 2021-10-17 MED ORDER — DIVALPROEX SODIUM 250 MG PO DR TAB: 500.0000 mg | DELAYED_RELEASE_TABLET | Freq: Two times a day (BID) | ORAL | 0 refills | Status: DC

## 2021-10-17 NOTE — Patient Instructions (Addendum)
Continue with Bernita Raisin as needed for the migraines  Will start Aimovig as migraine prevention  Will switch Depakote XR 1000 mg nightly (due to size of pills) to Depakote 250 mg (2 tabs BID) and continue to monitor patient.

## 2021-10-17 NOTE — Progress Notes (Signed)
[facility]  PATIENT: Brianna [name] DOB: 08-29-1981  REQUESTING CLINICIAN: Timoteo Gaul, MD HISTORY FROM: Patient  REASON FOR VISIT: Seizure and headaches management    HISTORICAL  CHIEF COMPLAINT:  Chief Complaint  Patient presents with   New Patient (Initial Visit)    Room 15, with husband, NP/Paper/[facility] [phone] of care, headaches and hx of seizures    HISTORY OF PRESENT ILLNESS:  This is a 40 year old woman with past medical history of seizure disorder, migraine headaches, hypothyroidism, asthma and chronic bronchitis who is presenting from management of her migraine headache and seizure disorder.  In terms of her migraines, started in her early 15s, stated pain is usually on the right side of the head described as throbbing pain 10 out of 10 and can travel to the left.  When she gets the pain it last the entire day and even sometimes linger the next morning.  For her migraine she tried numerous preventive medications including Topamax, amitriptyline, she is currently on Depakote for seizures, Lexapro and gabapentin.  For abortive medication patient reports trying over-the-counter medication, she was previously on triptan without much relief and currently she is on [place] which seem to help.  Migraines are associated with photophobia and phonophobia and nausea but no vomiting.     Headache History and Characteristics: Onset: early 53s  Location: Right side of head, can travel to the left  Quality:  Throbbing  Intensity: 10/10.  Duration: last the entire day up to the next morning  Migrainous Features: Photophobia, phonophobia, nausea.  Aura: No  History of brain injury or tumor: No  Family history: No  Motion sickness: no Cardiac history: no  OTC: tylenol, codeine  Prior prophylaxis: Propranolol: No  Verapamil:No TCA: Yes  Topamax: Yes Depakote: Yes for seizure Effexor: No Cymbalta:  No Neurontin:Yes  Lexapro: Yes   Prior abortives: Triptan: Yes  Anti-emetic: Yes, Zofran  Steroids: No Ergotamine suppository: No Ubrelvy: Yes    In terms of her seizures, she was diagnosed around 2013-2014, described the seizure as staring spells.  Her last seizure was more than 5 years ago, she is currently well-managed on Depakote 1000 mg nightly but reports difficulty taking the pills, described the pill as "large horse pill" that she cannot swallow and would like to try something different, no other side effects from the medications.  She has previously tried levetiracetam, lacosamide, lamotrigine, topiramate.   OTHER MEDICAL CONDITIONS: Seizure, migraines, hypothyroidism, asthma, chronic bronchitis,    REVIEW OF SYSTEMS: Full 14 system review of systems performed and negative with exception of: as noted in the HPI  ALLERGIES: Allergies  Allergen Reactions   Hydrocodone Nausea And Vomiting and Other (See Comments)   Peanut Oil Itching   Hydrocodone Other (See Comments)    Projectile vomiting   Neosporin [Bacitracin-Polymyxin B] Swelling    HOME MEDICATIONS: Outpatient Medications Prior to Visit  Medication Sig Dispense Refill   acetaminophen (TYLENOL) 500 MG tablet Take by mouth.     Divalproex Sodium (DEPAKOTE PO) Take 500 mg by mouth daily.     escitalopram (LEXAPRO) 10 MG tablet Take 10 mg by mouth daily.     gabapentin (NEURONTIN) 100 MG capsule Take 100 mg by mouth 3 (three) times daily.     levothyroxine (SYNTHROID) 25 MCG tablet Take 25 mcg by mouth daily before breakfast.     pantoprazole (PROTONIX) 40 MG tablet Take 40 mg by mouth daily.     Sodium Sulfate-Mag Sulfate-KCl (SUTAB)  [phone] MG TABS At 5 PM take 12 tablets using the 8 oz cup provided in the kit drinking 5 cups of water and 5 hours before your procedure repeat the same process. 24 tablet 0   Ubrogepant (UBRELVY) 100 MG TABS Take by mouth.     fluticasone furoate-vilanterol (BREO ELLIPTA) 200-25  MCG/INH AEPB Inhale 1 puff into the lungs daily.     sulfamethoxazole-trimethoprim (BACTRIM DS) 800-160 MG tablet Take 1 tablet by mouth 2 (two) times daily. 20 tablet 0   traMADol (ULTRAM) 50 MG tablet Take 1 tablet (50 mg total) by mouth every 6 (six) hours as needed. 12 tablet 0   No facility-administered medications prior to visit.    PAST MEDICAL HISTORY: History reviewed. No pertinent past medical history.  PAST SURGICAL HISTORY: Past Surgical History:  Procedure Laterality Date   ABDOMINAL HYSTERECTOMY     CHOLECYSTECTOMY     COLONOSCOPY WITH PROPOFOL N/A 10/05/2021   Procedure: COLONOSCOPY WITH PROPOFOL;  Surgeon: Jonathon Bellows, MD;  Location: [facility] ENDOSCOPY;  Service: Gastroenterology;  Laterality: N/A;   NEPHRECTOMY Left     FAMILY HISTORY: History reviewed. No pertinent family history.  SOCIAL HISTORY: Social History   Socioeconomic History   Marital status: Married    Spouse name: Not on file   Number of children: Not on file   Years of education: Not on file   Highest education level: Not on file  Occupational History   Not on file  Tobacco Use   Smoking status: Never   Smokeless tobacco: Never  Vaping Use   Vaping Use: Never used  Substance and Sexual Activity   Alcohol use: Not Currently   Drug use: Not Currently   Sexual activity: Not on file  Other Topics Concern   Not on file  Social History Narrative   ** Merged History Encounter **       Social Determinants of Health   Financial Resource Strain: Not on file  Food Insecurity: Not on file  Transportation Needs: Not on file  Physical Activity: Not on file  Stress: Not on file  Social Connections: Not on file  Intimate Partner Violence: Not on file     PHYSICAL EXAM  GENERAL EXAM/CONSTITUTIONAL: Vitals:  Vitals:   10/17/21 1522  BP: 116/72  Pulse: 70  Weight: 188 lb (85.3 kg)  Height: 4' 11"  (1.499 m)   Body mass index is 37.97 kg/m. Wt Readings from Last 3 Encounters:  10/17/21  188 lb (85.3 kg)  10/10/21 180 lb (81.6 kg)  10/05/21 184 lb (83.5 kg)   Patient is in no distress; well developed, nourished and groomed; neck is supple  CARDIOVASCULAR: Examination of carotid arteries is normal; no carotid bruits Regular rate and rhythm, no murmurs Examination of peripheral vascular system by observation and palpation is normal  EYES: Pupils round and reactive to light, Visual fields full to confrontation, Extraocular movements intacts,   MUSCULOSKELETAL: Gait, strength, tone, movements noted in Neurologic exam below  NEUROLOGIC: MENTAL STATUS:  No flowsheet data found. awake, alert, oriented to person, place and time recent and remote memory intact normal attention and concentration language fluent, comprehension intact, naming intact fund of knowledge appropriate  CRANIAL NERVE:  2nd, 3rd, 4th, 6th - pupils equal and reactive to light, visual fields full to confrontation, extraocular muscles intact, no nystagmus 5th - facial sensation symmetric 7th - facial strength symmetric 8th - hearing intact 9th - palate elevates symmetrically, uvula midline 11th - shoulder shrug symmetric 12th - tongue  protrusion midline  MOTOR:  normal bulk and tone, full strength in the BUE, BLE  SENSORY:  normal and symmetric to light touch, pinprick, temperature, vibration  COORDINATION:  finger-nose-finger, fine finger movements normal  REFLEXES:  deep tendon reflexes present and symmetric  GAIT/STATION:  normal   DIAGNOSTIC DATA (LABS, IMAGING, TESTING) - I reviewed patient records, labs, notes, testing and imaging myself where available.  Lab Results  Component Value Date   WBC 7.2 10/10/2021   HGB 14.0 10/10/2021   HCT 41.2 10/10/2021   MCV 92.2 10/10/2021   PLT 228 10/10/2021      Component Value Date/Time   NA 138 10/10/2021 1343   K 4.2 10/10/2021 1343   CL 104 10/10/2021 1343   CO2 26 10/10/2021 1343   GLUCOSE 95 10/10/2021 1343   BUN 8  10/10/2021 1343   CREATININE 1.03 (H) 10/10/2021 1343   CALCIUM 8.9 10/10/2021 1343   GFRNONAA >60 10/10/2021 1343   No results found for: CHOL, HDL, LDLCALC, LDLDIRECT, TRIG, CHOLHDL No results found for: HGBA1C No results found for: VITAMINB12 No results found for: TSH   MRI Brain 02/22/2021 1.  No acute intracranial abnormality or findings to suggest a potential candidate seizure focus.  2.  Few scattered T2/FLAIR hyperintensities in the subcortical white matter are nonspecific, but likely reflect foci of gliosis from prior insults.    EEG 04/26/2018 This is a mildly abnormal EEG due to the presence of occasional right temporal sharp wave discharges with some mild slowing noted.  This may suggest an underlying area of cortical irritability, and correlation with neuroimaging may be helpful.  In addition, if seizures are apparent, this would suggest a possible focal onset for her seizures.  ASSESSMENT AND PLAN  40 y.o. year old female with past medical history of migraine headaches, seizure disorder, asthma, chronic bronchitis who is presenting for management of her headaches and seizure disorder.  In terms of her headaches, she is currently on Ubrelvy as needed for migraine which seems to help but still having about 2 headache episodes per week which comes to 3 to 4 days of headache days per week.  She is interested in trying different preventive medications, I will start her on Aimovig to take monthly.  In terms of her seizure disorder she is well managed on Depakote 1000 mg XR nightly but report difficulty taking the pill due to the size.  She would like to try something different, she has tried previous medications including levetiracetam, lamotrigine, lacosamide and topiramate, I informed patient that I will continue her on Depakote but a different size pill but the same total daily dose, I will try her on Depakote 250 mg to take 2 tabs twice a day.  We will obtain a Depakote level and all  basic labs.  I will see her in 3 months for follow-up   1. Migraine without aura and without status migrainosus, not intractable   2. Seizure disorder ([place])   3. Therapeutic drug monitoring     PLAN: Continue with [name] as needed for the migraines  Will start Aimovig as migraine prevention  Will switch Depakote XR 1000 mg nightly (due to size of pills) to Depakote 250 mg (2 tabs BID) and continue to monitor patient.   Orders Placed This Encounter  Procedures   Valproic Acid Level   CMP   Vitamin D, 25-hydroxy   Pregnancy, urine    Meds ordered this encounter  Medications   Erenumab-aooe (AIMOVIG) 140 MG/ML SOAJ  Sig: Inject 140 mg into the skin every 30 (thirty) days.    Dispense:  1.12 mL    Refill:  3   divalproex (DEPAKOTE) 250 MG DR tablet    Sig: Take 2 tablets (500 mg total) by mouth 2 (two) times daily.    Dispense:  120 tablet    Refill:  0    Return in about 3 months (around 01/17/2022).    Alric Ran, MD 10/17/2021, 4:16 PM  [facility] [address] [place], [place] [address] [phone]

## 2021-10-18 ENCOUNTER — Telehealth: Payer: Self-pay | Admitting: *Deleted

## 2021-10-18 LAB — COMPREHENSIVE METABOLIC PANEL
ALT: 15 IU/L (ref 0–32)
AST: 18 IU/L (ref 0–40)
Albumin/Globulin Ratio: 1.5 (ref 1.2–2.2)
Albumin: 4.4 g/dL (ref 3.8–4.8)
Alkaline Phosphatase: 95 IU/L (ref 44–121)
BUN/Creatinine Ratio: 16 (ref 9–23)
BUN: 12 mg/dL (ref 6–24)
Bilirubin Total: <0.2 mg/dL (ref 0.0–1.2)
CO2: 26 mmol/L (ref 20–29)
Calcium: 9.3 mg/dL (ref 8.7–10.2)
Chloride: 104 mmol/L (ref 96–106)
Creatinine, Ser: 0.74 mg/dL (ref 0.57–1.00)
Globulin, Total: 2.9 g/dL (ref 1.5–4.5)
Glucose: 84 mg/dL (ref 70–99)
Potassium: 4.2 mmol/L (ref 3.5–5.2)
Sodium: 142 mmol/L (ref 134–144)
Total Protein: 7.3 g/dL (ref 6.0–8.5)
eGFR: 105 mL/min/{1.73_m2} (ref >59)

## 2021-10-18 LAB — VITAMIN D 25 HYDROXY (VIT D DEFICIENCY, FRACTURES): Vit D, 25-Hydroxy: 25.1 ng/mL — ABNORMAL LOW (ref 30.0–100.0)

## 2021-10-18 LAB — PREGNANCY, URINE: Preg Test, Ur: NEGATIVE

## 2021-10-18 LAB — VALPROIC ACID LEVEL: Valproic Acid Lvl: 68 ug/mL (ref 50–100)

## 2021-10-18 NOTE — Telephone Encounter (Signed)
PA for Aimovig 140mg  started on covermymeds (key: [id number]). Pt has pharmacy through [facility] (ID#[id number]). approved through 12/03/2022.

## 2021-10-19 ENCOUNTER — Telehealth: Payer: Self-pay | Admitting: Neurology

## 2021-10-19 DIAGNOSIS — E559 Vitamin D deficiency, unspecified: Secondary | ICD-10-CM

## 2021-10-19 MED ORDER — VITAMIN D (ERGOCALCIFEROL) 1.25 MG (50000 UNIT) PO CAPS
50000.0000 [IU] | ORAL_CAPSULE | ORAL | 0 refills | Status: DC
Start: 2021-10-19 — End: 2022-03-20

## 2021-10-19 NOTE — Telephone Encounter (Signed)
Spoke with patient, informed her of recent lab results including low Vit D level. I will prescribed Vit D supplement to take for 8 weeks then follow up with her PCP. She is comfortable with plans.   Windell Norfolk, MD

## 2021-10-24 ENCOUNTER — Telehealth: Payer: Self-pay | Admitting: Neurology

## 2021-10-24 NOTE — Telephone Encounter (Signed)
I spoke to the patient. She was given her first Aimovig injection last week and had significant pain. She would like me to call her friend on [place], Dulcy Fanny, at [phone] (who is a nurse is helping the patient with medication management). Per Sue Lush, she complained of pain with the first injection of Aimovig in her arm. It was not long lasting, mostly during the administration of the medication. Since this was the first time, no other site has been tried.

## 2021-10-24 NOTE — Telephone Encounter (Signed)
Pt is asking for a syringe for her Erenumab-aooe (AIMOVIG) 140 MG/ML SOAJ

## 2021-10-25 NOTE — Telephone Encounter (Signed)
I attempted to contact Dulcy Fanny to discuss options. Left a message for a return call.  1) We can ask MD for a prescription for Ajovy in pre-filled syringes. However, I called [facility] in advance and there is no guarantee that they can still order this product. They would have to have a prescription to be able to check availability.  2) The patient has only had one injection. Pain occurred just during the administration of the medication. They could give the medication another chance by using a different injection site. It may be better tolerated in the abdomen or thigh.

## 2021-10-25 NOTE — Telephone Encounter (Signed)
I spoke to the patient's friend who is a Engineer, civil (consulting), Dulcy Fanny (on [place]). She is now managing the medications for Mrs. Notarianni. She is going to talk with her about giving Aimovig a second chance. She plans to use a different injection site to see if it is more tolerable. She will call back with any further concerns.

## 2021-11-08 ENCOUNTER — Encounter: Payer: Self-pay | Admitting: Emergency Medicine

## 2021-11-08 ENCOUNTER — Ambulatory Visit
Admission: EM | Admit: 2021-11-08 | Discharge: 2021-11-08 | Disposition: A | Discharge status: Home / Self Care | Payer: 59

## 2021-11-08 DIAGNOSIS — L02416 Cutaneous abscess of left lower limb: Secondary | ICD-10-CM

## 2021-11-08 MED ORDER — DOXYCYCLINE HYCLATE 100 MG PO CAPS: 100.0000 mg | ORAL_CAPSULE | Freq: Two times a day (BID) | ORAL | 0 refills | Status: DC

## 2021-11-08 NOTE — ED Triage Notes (Signed)
Pt presents with a cyst in her inner days for a few days. Pt states she was prescribed doxycycline 2 weeks ago and it went away.

## 2021-11-08 NOTE — Discharge Instructions (Addendum)
Recommend covering abscess to reduce friction from clothing which is likely worsening pain. You may use over the counter neosporin to reduce infection and continue Hibiclens baths.

## 2021-11-11 NOTE — ED Provider Notes (Signed)
Brianna [name]    CSN: [id number] Arrival date & time: 11/08/21  1830      History   Chief Complaint Chief Complaint  Patient presents with   Cyst    HPI Brianna [name] is a 40 y.o. female.   HPI Patient with a history of recurrent skin abscesses presents today for evaluation of a skin abscess involving the upper groin region on the left side. She reports she has an appointment with a dermatologist in February however benefited from a prescription of doxycycline a few months ago that she got from her PCP.  She reports that the abscess is tender and she is concerned for infection.  She has been applying topical ointments without relief.  She denies any fever, nausea or vomiting. History reviewed. No pertinent past medical history.  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      Home Medications    Prior to Admission medications   Medication Sig Start Date End Date Taking? Authorizing Provider  acetaminophen (TYLENOL) 500 MG tablet Take by mouth.   Yes [provider]  divalproex (DEPAKOTE) 250 MG DR tablet Take 2 tablets (500 mg total) by  mouth 2 (two) times daily. 10/17/21 11/16/21 Yes Alric Ran, MD  Divalproex Sodium (DEPAKOTE PO) Take 500 mg by mouth daily.   Yes [provider]  doxycycline (VIBRAMYCIN) 100 MG capsule Take 1 capsule (100 mg total) by mouth 2 (two) times daily. 11/08/21  Yes Scot Jun, FNP  Erenumab-aooe (AIMOVIG) 140 MG/ML SOAJ Inject 140 mg into the skin every 30 (thirty) days. 10/17/21  Yes Camara, Maryan Puls, MD  escitalopram (LEXAPRO) 10 MG tablet Take 10 mg by mouth daily. 09/11/21  Yes [provider]  gabapentin (NEURONTIN) 100 MG capsule Take 100 mg by mouth 3 (three) times daily.   Yes [provider]  HIBICLENS 4 % external liquid Apply 1 application topically 2 (two) times daily. 10/15/21  Yes [provider]  levothyroxine (SYNTHROID) 25 MCG tablet Take 25 mcg by mouth daily before breakfast.   Yes [provider]  pantoprazole (PROTONIX) 40 MG tablet Take 40 mg by mouth daily. 08/02/21  Yes [provider]  Sodium Sulfate-Mag Sulfate-KCl (SUTAB) [phone] MG TABS At 5 PM take 12 tablets using the 8 oz cup provided in the kit drinking 5 cups of water and 5 hours before your procedure repeat the same process. 10/04/21   Jonathon Bellows, MD  Ubrogepant (UBRELVY) 100 MG TABS Take by mouth.    [provider]  Vitamin D, Ergocalciferol, (DRISDOL) 1.25 MG (50000 UNIT) CAPS capsule Take 1 capsule (50,000 Units total) by mouth every 7 (seven) days. 10/19/21  Alric Ran, MD    Family History No family history on file.  Social History Social History   Tobacco Use   Smoking status: Never   Smokeless tobacco: Never  Vaping Use   Vaping Use: Never used  Substance Use Topics   Alcohol use: Not Currently   Drug use: Not Currently     Allergies   Hydrocodone, Peanut oil, Hydrocodone, and Neosporin [bacitracin-polymyxin b]   Review of Systems Review of Systems Pertinent negatives listed in HPI  Physical Exam Triage Vital  Signs ED Triage Vitals  Enc Vitals Group     BP 11/08/21 1901 118/75     Pulse Rate 11/08/21 1901 72     Resp 11/08/21 1901 18     Temp 11/08/21 1901 98.3 F (36.8 C)     Temp Source 11/08/21 1901 Oral     SpO2 11/08/21 1901 96 %     Weight --      Height --      Head Circumference --      Peak Flow --      Pain Score 11/08/21 1859 7     Pain Loc --      Pain Edu? --      Excl. in [place]? --    No data found.  Updated Vital Signs BP 118/75 (BP Location: Left Arm)   Pulse 72   Temp 98.3 F (36.8 C) (Oral)   Resp 18   SpO2 96%   Visual Acuity Right Eye Distance:   Left Eye Distance:   Bilateral Distance:    Right Eye Near:   Left Eye Near:    Bilateral Near:     Physical Exam Constitutional:      Appearance: She is obese.  HENT:     Head: Normocephalic.  Cardiovascular:     Rate and Rhythm: Normal rate and regular rhythm.  Pulmonary:     Effort: Pulmonary effort is normal.     Breath sounds: Normal breath sounds.  Skin:    General: Skin is warm.     Findings: Abscess present.          Comments: Nonfluctuant, 6 mm in diameter, tenderness with palpation, no significant erythema  Neurological:     General: No focal deficit present.     Mental Status: She is alert and oriented to person, place, and time.     Husband present in room during exam UC Treatments / Results  Labs (all labs ordered are listed, but only abnormal results are displayed) Labs Reviewed - No data to display  EKG   Radiology No results found.  Procedures Procedures (including critical care time)  Medications Ordered in UC Medications - No data to display  Initial Impression / Assessment and Plan / UC Course  I have reviewed the triage vital signs and the nursing notes.  Pertinent labs & imaging results that were available during my care of the patient were reviewed by me and considered in my medical decision making (see chart for details).    Recurrent abscess of the left  thigh, no obvious evidence of recurrent infection however at the request of patient I did prescribe her some doxycycline however I recommended that she did not start the course of doxycycline unless the abscess becomes inflamed, severely tender or appears increasing in diameter, Suspect current tenderness is related to friction. Recommend using an ace wrap when walking for prolonged periods of time or to prevent clothing from rubbing against the abscess.  Continue  topical management. Final Clinical Impressions(s) / UC Diagnoses   Final diagnoses:  Abscess of left thigh     Discharge Instructions      Recommend covering abscess to reduce friction from clothing which is likely worsening pain. You may use over the counter neosporin to reduce infection and continue Hibiclens baths.    ED Prescriptions     Medication Sig Dispense Auth. Provider   doxycycline (VIBRAMYCIN) 100 MG capsule Take 1 capsule (100 mg total) by mouth 2 (two) times daily. 20 capsule Scot Jun, FNP      PDMP not reviewed this encounter.   Scot Jun, FNP 11/11/21 [phone]

## 2021-12-13 ENCOUNTER — Other Ambulatory Visit: Payer: Self-pay | Admitting: Neurology

## 2021-12-14 NOTE — Telephone Encounter (Signed)
Rx refilled.

## 2022-01-09 ENCOUNTER — Ambulatory Visit: Payer: 59 | Admitting: Neurology

## 2022-01-12 ENCOUNTER — Ambulatory Visit: Payer: 59 | Admitting: Neurology

## 2022-01-15 ENCOUNTER — Encounter: Payer: Self-pay | Admitting: Neurology

## 2022-01-30 ENCOUNTER — Emergency Department
Admission: EM | Admit: 2022-01-30 | Discharge: 2022-01-30 | Disposition: A | Discharge status: Home / Self Care | Payer: 59 | Attending: Emergency Medicine | Admitting: Emergency Medicine

## 2022-01-30 ENCOUNTER — Other Ambulatory Visit: Payer: Self-pay

## 2022-01-30 DIAGNOSIS — L732 Hidradenitis suppurativa: Secondary | ICD-10-CM | POA: Diagnosis not present

## 2022-01-30 DIAGNOSIS — L02415 Cutaneous abscess of right lower limb: Secondary | ICD-10-CM | POA: Diagnosis present

## 2022-01-30 MED ORDER — DOXYCYCLINE MONOHYDRATE 100 MG PO TABS: ORAL_TABLET | ORAL | 3 refills | Status: DC

## 2022-01-30 MED ORDER — MELOXICAM 15 MG PO TABS: 15.0000 mg | ORAL_TABLET | Freq: Every day | ORAL | 2 refills | Status: DC

## 2022-01-30 NOTE — ED Provider Triage Note (Signed)
Emergency Medicine Provider Triage Evaluation Note  Brianna [name] , a 41 y.o. female  was evaluated in triage.  Pt complains of cyst on right hip.  States every time she stops doxycycline she gets another abscess..  Review of Systems  Positive: Abscess Negative: Fever  Physical Exam  There were no vitals taken for this visit. Gen:   Awake, no distress   Resp:  Normal effort  MSK:   Moves extremities without difficulty  Other:    Medical Decision Making  Medically screening exam initiated at 2:46 PM.  Appropriate orders placed.  [name] was informed that the remainder of the evaluation will be completed by another provider, this initial triage assessment does not replace that evaluation, and the importance of remaining in the ED until their evaluation is complete.     Faythe Ghee, PA-C 01/30/22 1450

## 2022-01-30 NOTE — Discharge Instructions (Signed)
Follow-up with your regular doctor.  Please call for appointment.  Take medication as prescribed

## 2022-01-30 NOTE — ED Notes (Signed)
See triage note  presents with a small raised red area to right hip area

## 2022-01-30 NOTE — ED Provider Notes (Signed)
[facility] Provider Note    Event Date/Time   First MD Initiated Contact with Patient 01/30/22 1629     (approximate)   History   Abscess (/)   HPI  Brianna [name] is a 41 y.o. female presents emergency department complaining of abscess/cyst on her right hip.  Patient states that she has hidradenitis.  Every time she stops taking the doxycycline will take about a week and she will start getting small cyst all over her.  Is having a lot of pain in the same area.  No fever or chills.      Physical Exam   Triage Vital Signs: ED Triage Vitals  Enc Vitals Group     BP 01/30/22 1448 95/85     Pulse Rate 01/30/22 1448 62     Resp 01/30/22 1448 14     Temp 01/30/22 1448 98.4 F (36.9 C)     Temp Source 01/30/22 1448 Oral     SpO2 01/30/22 1448 97 %     Weight 01/30/22 1446 187 lb 6.3 oz (85 kg)     Height 01/30/22 1446 4\' 11"  (1.499 m)     Head Circumference --      Peak Flow --      Pain Score 01/30/22 1446 2     Pain Loc --      Pain Edu? --      Excl. in [place]? --     Most recent vital signs: Vitals:   01/30/22 1448  BP: 95/85  Pulse: 62  Resp: 14  Temp: 98.4 F (36.9 C)  SpO2: 97%     General: Awake, no distress.   CV:  Good peripheral perfusion. regular rate and  rhythm Resp:  Normal effort. Lungs CTA Abd:  No distention.   Other:  Area on the right hip is already burst and scabbed over is very small proximately 1 to 2 cm at the most, nonfluctuant, no erythema surrounding the area.   ED Results / Procedures / Treatments   Labs (all labs ordered are listed, but only abnormal results are displayed) Labs Reviewed - No data to display   EKG     RADIOLOGY     PROCEDURES:   Procedures   MEDICATIONS ORDERED IN ED: Medications - No data to display   IMPRESSION / MDM / ASSESSMENT AND PLAN / ED COURSE  I reviewed the triage vital signs and the nursing notes.                              Differential diagnosis  includes, but is not limited to, chronic hidradenitis, cystic acne, abscess  I did explain these findings to the patient.  Told her is a very minimal area.  Patient wants to argue about her needing antibiotics.  Explained to her that with hidradenitis antibiotics may or may not help.  We can start her on doxycycline and she will take 1 week twice daily and then 1 p.o. daily following that.  Follow-up with her regular dermatologist.  Explained her there is no reason for her to take anything stronger than meloxicam or ibuprofen.  Patient agrees to treatment plan.  She is discharged stable condition.         FINAL CLINICAL IMPRESSION(S) / ED DIAGNOSES   Final diagnoses:  Hidradenitis     Rx / DC Orders   ED Discharge Orders  Ordered    doxycycline (ADOXA) 100 MG tablet        01/30/22 1648    meloxicam (MOBIC) 15 MG tablet  Daily        01/30/22 1648             Note:  This document was prepared using Dragon voice recognition software and may include unintentional dictation errors.    Faythe Ghee, PA-C 01/30/22 1816    Gilles Chiquito, MD 01/30/22 Corky Crafts

## 2022-01-30 NOTE — ED Triage Notes (Signed)
Reports abscess to right hip X 2 weeks. Hx of same. On assessment, small area to right hip appears mildly swollen. No drainage, or spreading of redness.

## 2022-02-02 ENCOUNTER — Ambulatory Visit: Payer: 59 | Admitting: Neurology

## 2022-02-06 ENCOUNTER — Ambulatory Visit: Payer: 59 | Admitting: Neurology

## 2022-02-12 ENCOUNTER — Other Ambulatory Visit: Payer: Self-pay | Admitting: Neurology

## 2022-02-13 NOTE — Telephone Encounter (Signed)
Rx refilled.

## 2022-03-20 ENCOUNTER — Ambulatory Visit (INDEPENDENT_AMBULATORY_CARE_PROVIDER_SITE_OTHER): Payer: 59 | Admitting: Neurology

## 2022-03-20 ENCOUNTER — Encounter: Payer: Self-pay | Admitting: Neurology

## 2022-03-20 ENCOUNTER — Telehealth: Payer: Self-pay | Admitting: Neurology

## 2022-03-20 VITALS — BP 119/83 | HR 77 | Ht 59.0 in | Wt 182.6 lb

## 2022-03-20 DIAGNOSIS — G40909 Epilepsy, unspecified, not intractable, without status epilepticus: Secondary | ICD-10-CM

## 2022-03-20 DIAGNOSIS — G43009 Migraine without aura, not intractable, without status migrainosus: Secondary | ICD-10-CM | POA: Diagnosis not present

## 2022-03-20 DIAGNOSIS — E559 Vitamin D deficiency, unspecified: Secondary | ICD-10-CM

## 2022-03-20 DIAGNOSIS — R569 Unspecified convulsions: Secondary | ICD-10-CM

## 2022-03-20 NOTE — Progress Notes (Signed)
? ?[facility] ? ?PATIENT: Brianna [name] ?DOB: October 23, 1981 ? ?REQUESTING CLINICIAN: No ref. provider found ?HISTORY FROM: Patient  ?REASON FOR VISIT: Seizure and headaches management  ? ? ?HISTORICAL ? ?CHIEF COMPLAINT:  ?Chief Complaint  ?Patient presents with  ? Follow-up  ?  Rm 17. Accompanied by husband. ?Pt states migraines are less frequent. She had first migraine since last visit last night. [name] provided migraine relief.  ? ?INTERVAL HISTORY 03/20/2022:  ?Patient presents today for follow-up, since last visit in November she has been doing well in terms of her migraines.  She started the Aimovig and reported yesterday was the first days that she took [place] for an acute migraine treatment.  Otherwise she has been doing well.  In terms of the seizures, she is on Depakote 500 mg twice daily, her last seizure were more than 5 years and she is interested in coming off medication.  Her last Depakote level was normal at 68.  Denies any seizures since last visit.  Denies any side effect from the medication. ? ? ?HISTORY OF PRESENT ILLNESS:  ?This is a 41 year old woman with past medical history of seizure disorder, migraine headaches, hypothyroidism, asthma and chronic bronchitis who is presenting from management of her migraine headache and seizure disorder.  In terms of her migraines, started in her early 60s, stated pain is usually on the right side of the head described as throbbing pain 10 out of 10 and can travel to the left.  When she gets the pain it last the entire day and even sometimes linger the next morning.  For her migraine she tried numerous preventive medications including Topamax, amitriptyline, she is currently on Depakote for seizures, Lexapro and gabapentin.  For abortive medication patient reports trying over-the-counter medication, she was previously on triptan without much relief and currently she is on [place] which seem to help.  Migraines are associated with  photophobia and phonophobia and nausea but no vomiting.   ? ? ?Headache History and Characteristics: ?Onset: early 64s  ?Location: Right side of head, can travel to the left  ?Quality:  Throbbing  ?Intensity: 10/10.  ?Duration: last the entire day up to the next morning  ?Migrainous Features: Photophobia, phonophobia, nausea.  ?Aura: No  ?History of brain injury or tumor: No ? ?Family history: No  ?Motion sickness: no ?Cardiac history: no ? ?OTC: tylenol, codeine ? ?Prior prophylaxis: ?Propranolol: No  ?Verapamil:No ?TCA: Yes  ?Topamax: Yes ?Depakote: Yes for seizure ?Effexor: No ?Cymbalta: No ?Neurontin:Yes  ?Lexapro: Yes  ? ?Prior abortives: ?Triptan: Yes  ?Anti-emetic: Yes, Zofran  ?Steroids: No ?Ergotamine suppository: No ?Ubrelvy: Yes  ? ? ?In terms of her seizures, she was diagnosed around 2013-2014, described the seizure as staring spells.  Her last seizure was more than 5 years ago, she is currently well-managed on Depakote 1000 mg nightly but reports difficulty taking the pills, described the pill as "large horse pill" that she cannot swallow and would like to try something different, no other side effects from the medications.  She has previously tried levetiracetam, lacosamide, lamotrigine, topiramate. ? ? ?OTHER MEDICAL CONDITIONS: Seizure, migraines, hypothyroidism, asthma, chronic bronchitis,  ? ? ?REVIEW OF SYSTEMS: Full 14 system review of systems performed and negative with exception of: as noted in the HPI ? ?ALLERGIES: ?Allergies  ?Allergen Reactions  ? Hydrocodone Nausea And Vomiting and Other (See Comments)  ? Peanut Oil Itching  ? Hydrocodone Other (See Comments)  ?  Projectile vomiting  ? Neosporin [Bacitracin-Polymyxin B] Swelling  ? ? ?  HOME MEDICATIONS: ?Outpatient Medications Prior to Visit  ?Medication Sig Dispense Refill  ? divalproex (DEPAKOTE) 250 MG DR tablet TAKE 2 TABLETS BY MOUTH 2 TIMES DAILY. 120 tablet 0  ? doxycycline (ADOXA) 100 MG tablet He is 1 twice a day for the first week  then start taking 1/day until seen by your dermatologist. 37 tablet 3  ? Erenumab-aooe (AIMOVIG) 140 MG/ML SOAJ INJECT 140 MG INTO THE SKIN EVERY 30 DAYS 1 mL 3  ? escitalopram (LEXAPRO) 10 MG tablet Take 10 mg by mouth daily.    ? gabapentin (NEURONTIN) 100 MG capsule Take 100 mg by mouth 3 (three) times daily.    ? HIBICLENS 4 % external liquid Apply 1 application topically 2 (two) times daily.    ? HUMIRA PEN-CD/UC/HS STARTER 80 MG/0.8ML PNKT Inject 0.8 mLs into the skin.    ? levothyroxine (SYNTHROID) 25 MCG tablet Take 25 mcg by mouth daily before breakfast.    ? meloxicam (MOBIC) 15 MG tablet Take 1 tablet (15 mg total) by mouth daily. 30 tablet 2  ? pantoprazole (PROTONIX) 40 MG tablet Take 40 mg by mouth daily.    ? Sodium Sulfate-Mag Sulfate-KCl (SUTAB) [phone] MG TABS At 5 PM take 12 tablets using the 8 oz cup provided in the kit drinking 5 cups of water and 5 hours before your procedure repeat the same process. 24 tablet 0  ? Ubrogepant (UBRELVY) 100 MG TABS Take by mouth.    ? acetaminophen (TYLENOL) 500 MG tablet Take by mouth.    ? Divalproex Sodium (DEPAKOTE PO) Take 500 mg by mouth daily.    ? Vitamin D, Ergocalciferol, (DRISDOL) 1.25 MG (50000 UNIT) CAPS capsule Take 1 capsule (50,000 Units total) by mouth every 7 (seven) days. 8 capsule 0  ? ?No facility-administered medications prior to visit.  ? ? ?PAST MEDICAL HISTORY: ?History reviewed. No pertinent past medical history. ? ?PAST SURGICAL HISTORY: ?Past Surgical History:  ?Procedure Laterality Date  ? ABDOMINAL HYSTERECTOMY    ? CHOLECYSTECTOMY    ? COLONOSCOPY WITH PROPOFOL N/A 10/05/2021  ? Procedure: COLONOSCOPY WITH PROPOFOL;  Surgeon: Jonathon Bellows, MD;  Location: [facility] ENDOSCOPY;  Service: Gastroenterology;  Laterality: N/A;  ? NEPHRECTOMY Left   ? ? ?FAMILY HISTORY: ?History reviewed. No pertinent family history. ? ?SOCIAL HISTORY: ?Social History  ? ?Socioeconomic History  ? Marital status: Married  ?  Spouse name: Not on file  ? Number  of children: Not on file  ? Years of education: Not on file  ? Highest education level: Not on file  ?Occupational History  ? Not on file  ?Tobacco Use  ? Smoking status: Never  ? Smokeless tobacco: Never  ?Vaping Use  ? Vaping Use: Never used  ?Substance and Sexual Activity  ? Alcohol use: Not Currently  ? Drug use: Not Currently  ? Sexual activity: Not on file  ?Other Topics Concern  ? Not on file  ?Social History Narrative  ? ** Merged History Encounter **  ?    ? ?Social Determinants of Health  ? ?Financial Resource Strain: Not on file  ?Food Insecurity: Not on file  ?Transportation Needs: Not on file  ?Physical Activity: Not on file  ?Stress: Not on file  ?Social Connections: Not on file  ?Intimate Partner Violence: Not on file  ? ? ? ?PHYSICAL EXAM ? ?GENERAL EXAM/CONSTITUTIONAL: ?Vitals:  ?Vitals:  ? 03/20/22 1135  ?BP: 119/83  ?Pulse: 77  ?Weight: 182 lb 9.6 oz (82.8 kg)  ?Height: 4' 11"  (  1.499 m)  ? ?Body mass index is 36.88 kg/m?. ?Wt Readings from Last 3 Encounters:  ?03/20/22 182 lb 9.6 oz (82.8 kg)  ?01/30/22 187 lb 6.3 oz (85 kg)  ?10/17/21 188 lb (85.3 kg)  ? ?Patient is in no distress; well developed, nourished and groomed; neck is supple ? ?EYES: ?Pupils round and reactive to light, Visual fields full to confrontation, Extraocular movements intacts,  ? ?MUSCULOSKELETAL: ?Gait, strength, tone, movements noted in Neurologic exam below ? ?NEUROLOGIC: ?MENTAL STATUS:  ?   ? View : No data to display.  ?  ?  ?  ? ?awake, alert, oriented to person, place and time ?recent and remote memory intact ?normal attention and concentration ?language fluent, comprehension intact, naming intact ?fund of knowledge appropriate ? ?CRANIAL NERVE:  ?2nd, 3rd, 4th, 6th - pupils equal and reactive to light, visual fields full to confrontation, extraocular muscles intact, no nystagmus ?5th - facial sensation symmetric ?7th - facial strength symmetric ?8th - hearing intact ?9th - palate elevates symmetrically, uvula  midline ?11th - shoulder shrug symmetric ?12th - tongue protrusion midline ? ?MOTOR:  ?normal bulk and tone, full strength in the BUE, BLE ? ?SENSORY:  ?normal and symmetric to light touch, pinprick, temperature,

## 2022-03-20 NOTE — Patient Instructions (Signed)
Continue with Bernita Raisin as needed for the migraines  ?Continue with Aimovig as migraine prevention  ?We will obtain a routine EEG, if normal then will plan Depakote taper. ?Follow-up to primary care doctor ?Return in 23-month or sooner if worse. ?

## 2022-03-20 NOTE — Addendum Note (Signed)
Addended byWindell Norfolk on: 03/20/2022 12:07 PM ? ? Modules accepted: Orders ? ?

## 2022-03-20 NOTE — Telephone Encounter (Signed)
This was just for a 1 hour eeg but we can do a 24 hrs EEG with HPN. I will put the new order.  ? ?

## 2022-03-20 NOTE — Telephone Encounter (Signed)
I spoke to the patient and let her know the order was changed and she will get a call to set up. ?

## 2022-03-20 NOTE — Telephone Encounter (Signed)
When trying to schedule prolonged EEG, patient asked if she could have it done at her house since she has done this in the past? I did not schedule anything and told her we would give her a call back. ?

## 2022-03-23 ENCOUNTER — Other Ambulatory Visit: Payer: 59 | Admitting: *Deleted

## 2022-03-23 ENCOUNTER — Emergency Department: Payer: 59

## 2022-03-23 ENCOUNTER — Emergency Department
Admission: EM | Admit: 2022-03-23 | Discharge: 2022-03-23 | Disposition: A | Discharge status: Home / Self Care | Payer: 59 | Attending: Emergency Medicine | Admitting: Emergency Medicine

## 2022-03-23 ENCOUNTER — Other Ambulatory Visit: Payer: Self-pay

## 2022-03-23 DIAGNOSIS — B9789 Other viral agents as the cause of diseases classified elsewhere: Secondary | ICD-10-CM | POA: Diagnosis not present

## 2022-03-23 DIAGNOSIS — R051 Acute cough: Secondary | ICD-10-CM | POA: Diagnosis present

## 2022-03-23 DIAGNOSIS — J069 Acute upper respiratory infection, unspecified: Secondary | ICD-10-CM | POA: Insufficient documentation

## 2022-03-23 DIAGNOSIS — Z20822 Contact with and (suspected) exposure to covid-19: Secondary | ICD-10-CM | POA: Insufficient documentation

## 2022-03-23 LAB — RESP PANEL BY RT-PCR (FLU A&B, COVID) ARPGX2
Influenza A by PCR: NEGATIVE
Influenza B by PCR: NEGATIVE
SARS Coronavirus 2 by RT PCR: NEGATIVE

## 2022-03-23 MED ORDER — IPRATROPIUM-ALBUTEROL 0.5-2.5 (3) MG/3ML IN SOLN
3.0000 mL | Freq: Once | RESPIRATORY_TRACT | Status: AC
Start: 2022-03-23 — End: 2022-03-23
  Administered 2022-03-23: 3 mL via RESPIRATORY_TRACT
  Filled 2022-03-23: qty 3

## 2022-03-23 MED ORDER — BENZONATATE 100 MG PO CAPS: 100.0000 mg | ORAL_CAPSULE | Freq: Four times a day (QID) | ORAL | 0 refills | Status: DC | PRN

## 2022-03-23 MED ORDER — PREDNISONE 50 MG PO TABS: 50.0000 mg | ORAL_TABLET | Freq: Every day | ORAL | 0 refills | Status: DC

## 2022-03-23 NOTE — ED Triage Notes (Signed)
Pt c/o cough congestion for the past week, pt is in NAD ?

## 2022-03-23 NOTE — ED Notes (Signed)
Pt with URI symptoms and cough x one week. ?

## 2022-03-23 NOTE — ED Provider Notes (Signed)
? ?  [facility] ?Provider Note ? ? ? Event Date/Time  ? First MD Initiated Contact with Patient 03/23/22 1358   ?  (approximate) ? ? ?History  ? ?URI ? ? ?HPI ? ?Brianna [name] is a 41 y.o. female who presents with complaints of cough congestion fatigue chills for the last week.  She also complains of myalgias.  Patient reports she feels rundown.  Denies shortness of breath but does report productive cough. ?  ? ? ?Physical Exam  ? ?Triage Vital Signs: ?ED Triage Vitals  ?Enc Vitals Group  ?   BP 03/23/22 1348 136/88  ?   Pulse Rate 03/23/22 1348 79  ?   Resp 03/23/22 1348 17  ?   Temp 03/23/22 1348 98.8 ?F (37.1 ?C)  ?   Temp Source 03/23/22 1348 Oral  ?   SpO2 03/23/22 1348 99 %  ?   Weight --   ?   Height --   ?   Head Circumference --   ?   Peak Flow --   ?   Pain Score 03/23/22 1548 0  ?   Pain Loc --   ?   Pain Edu? --   ?   Excl. in [place]? --   ? ? ?Most recent vital signs: ?Vitals:  ? 03/23/22 1348  ?BP: 136/88  ?Pulse: 79  ?Resp: 17  ?Temp: 98.8 ?F (37.1 ?C)  ?SpO2: 99%  ? ? ? ?General: Awake, no distress.  ?CV:  Good peripheral perfusion.  ?Resp:  Normal effort.  Clear to auscultation bilaterally ?Abd:  No distention.  ?Other:   ? ? ?ED Results / Procedures / Treatments  ? ?Labs ?(all labs ordered are listed, but only abnormal results are displayed) ?Labs Reviewed  ?RESP PANEL BY RT-PCR (FLU A&B, COVID) ARPGX2  ? ? ? ?EKG ? ? ? ? ?RADIOLOGY ?Chest x-ray reviewed by me, no evidence of pneumonia ? ? ? ?PROCEDURES: ? ?Critical Care performed:  ?Procedures ? ? ?MEDICATIONS ORDERED IN ED: ?Medications  ?ipratropium-albuterol (DUONEB) 0.5-2.5 (3) MG/3ML nebulizer solution 3 mL (3 mLs Nebulization Given 03/23/22 1425)  ? ? ? ?IMPRESSION / MDM / ASSESSMENT AND PLAN / ED COURSE  ?I reviewed the triage vital signs and the nursing notes. ? ? ? ?Patient presents with symptoms consistent with viral upper respiratory infection.  COVID and flu swab sent which were negative. ? ?Chest x-ray obtained to  evaluate for possible pneumonia ? ?Chest x-ray is reassuring.  We will treat with Tessalon Perles prednisone for cough, recommend supportive care. ? ? ? ?  ? ? ?FINAL CLINICAL IMPRESSION(S) / ED DIAGNOSES  ? ?Final diagnoses:  ?Viral URI with cough  ? ? ? ?Rx / DC Orders  ? ?ED Discharge Orders   ? ?      Ordered  ?  predniSONE (DELTASONE) 50 MG tablet  Daily with breakfast       ? 03/23/22 1529  ?  benzonatate (TESSALON PERLES) 100 MG capsule  Every 6 hours PRN       ? 03/23/22 1529  ? ?  ?  ? ?  ? ? ? ?Note:  This document was prepared using Dragon voice recognition software and may include unintentional dictation errors. ?  ?Jene Every, MD ?03/23/22 2007 ? ?

## 2022-03-31 NOTE — Telephone Encounter (Signed)
Pt is asking for a call re:her 24 hrs EEG being scheduled ?

## 2022-04-04 ENCOUNTER — Telehealth: Payer: Self-pay | Admitting: Neurology

## 2022-04-04 NOTE — Telephone Encounter (Signed)
I spoke to the patient. She started having a migraine 03/30/22. Melburn Hake which improved discomfort. Some residual pain but better over the weekend. Headache has worsened today. No relief at home. Rating it 9 on pain scale. She is considering going to ED for IV migraine cocktail. She wanted to check with our office first on alternate treatment options. ? ?She also ask about having a repeat MRI brain. Last one 02/22/21 at [facility]. Previously, says she was getting one every six months for monitoring.  ? ?She would like a call back as soon as possible. She is aware we are going to check with Dr. April Manson.  ?

## 2022-04-04 NOTE — Telephone Encounter (Signed)
Please contact patient to see if she is willing to come to the office for migraine cocktail infusion. Please inform her that we will discuss need for repeat MRI Brain at our next visit.

## 2022-04-04 NOTE — Telephone Encounter (Signed)
I called the patient back to offer the ordered infusion.We can do this in our office anytime before 3pm. Explained she would be getting fluids, 1 gram of Depakote and 30mg  of Toradol. She said she did not have gas in her car to get to the office and does not get paid until midnight. States she would rather call an ambulance and proceed to [facility] for migraine treatment.  ? ?I also notified her that a repeat MRI brain would be discussed at her next office visit.  ?

## 2022-04-04 NOTE — Telephone Encounter (Signed)
Pt started having migraines last Thursday 4/27 lasting all day. Pain level is at a 9 right now. Would like a call from the nurse. ?

## 2022-04-05 NOTE — Addendum Note (Signed)
Addended by: Lindell Spar C on: 04/05/2022 03:59 PM ? ? Modules accepted: Orders ? ?

## 2022-04-05 NOTE — Addendum Note (Signed)
Addended by: Lindell Spar C on: 04/05/2022 03:53 PM ? ? Modules accepted: Orders ? ?

## 2022-04-06 ENCOUNTER — Telehealth: Payer: Self-pay | Admitting: Neurology

## 2022-04-06 NOTE — Telephone Encounter (Signed)
Faxed Ambulatory EEG to [facility]. ?

## 2022-04-10 NOTE — Telephone Encounter (Signed)
I spoke to the patient. She does not have an active migraine today. She did not go to the ED last week but decided to sleep it off. She has only injected Aimovig 140mg  twice. I told her it may be too soon to give up on it just yet. Typically, we ask patients to try it for at least three months. She is agreeable to this plan. ? ?She is aware her ambulatory EEG orders were resent on 04/06/22. She should expected a call in the next week or two for scheduling.  ?

## 2022-04-10 NOTE — Telephone Encounter (Signed)
Pt has asked the message be sent back stating this month she has had 2 pretty major migraines, she'd like to know if there is another injection should could possibly take so her body does not reject the Erenumab-aooe (AIMOVIG) 140 MG/ML SOAJ.  Pt also asked it be noted she has not been contacted yet to schedule her At home EEG ?

## 2022-04-10 NOTE — Telephone Encounter (Signed)
Thank you Michelle.  

## 2022-04-13 NOTE — Telephone Encounter (Signed)
Pt states she was speaking with someone re: the scheduling of her EEG and was told that with her [facility] insurance 80% would be covered she would have to pay $300.00 or more thru her Medicaid for the test.  Pt is asking that another location be found where she would not have to pay as much. ?

## 2022-04-15 ENCOUNTER — Telehealth: Payer: Self-pay | Admitting: Psychiatry

## 2022-04-15 NOTE — Telephone Encounter (Signed)
Received call that patient had a migraine. She took one dose of Ubrelvy 1.5 hours ago. Advised that she can repeat a dose 2 hours after her first dose. She replied that she usually has to go to the emergency room to get a migraine cocktail for her severe migraines. Recommended she try a second dose of Ubrelvy first, then if headache persists she can go to the ED for a migraine cocktail. ? ?Brianna [name] ?04/15/22 ?3:21 PM ? ?

## 2022-04-17 NOTE — Telephone Encounter (Signed)
I spoke to the patient. After calling the on-call MD, she took the second dose of [name] this past Saturday. Coupled with Ubrelvy and rest, the migraine resolved after several hours. She has only had two injections of Aimovig (repeatedly informed to give it at least three months). She is till tapering off divalproex (currently taking 250mg , one tab BID). Per Dr. April Manson, he can offer to send in new rx to remain on divalproex at previous dosage. She does not wish to make any medication changes until seen. She also is insistent that she wants a repeat MRI brain. She has been scheduled for a mychart visit on 04/27/22.  ?

## 2022-04-17 NOTE — Telephone Encounter (Signed)
Pt said, had migraine Saturday took 2 tablets of Ubrelvy ,was a pain level of 10. Would like to come in today or tomorrow to get an MRI done. Would like a call from the nurse. ?

## 2022-04-26 ENCOUNTER — Telehealth: Payer: 59 | Admitting: Neurology

## 2022-05-02 ENCOUNTER — Other Ambulatory Visit: Payer: Self-pay | Admitting: Neurology

## 2022-05-09 ENCOUNTER — Telehealth: Payer: Self-pay | Admitting: Neurology

## 2022-05-09 NOTE — Telephone Encounter (Signed)
Pt called wanting to know about her EEG results.

## 2022-05-09 NOTE — Telephone Encounter (Signed)
The patient states she completed a 24-hour ambulatory EEG last week. She would like a call with the results, once available.

## 2022-05-10 NOTE — Telephone Encounter (Signed)
Will complete it by end of the week.   Thank you

## 2022-05-10 NOTE — Telephone Encounter (Signed)
I called and provided this update to the patient.

## 2022-05-15 ENCOUNTER — Encounter: Payer: Self-pay | Admitting: Neurology

## 2022-05-15 NOTE — Telephone Encounter (Signed)
EEG is normal. I called and left her a message and also sent her a mychart message.

## 2022-05-15 NOTE — Telephone Encounter (Signed)
Patient has left at least 2 voicemails with our office this morning.  She is requesting her EEG results.  I responded to her MyChart message advising her that Dr. Teresa Coombs will read her EEG as soon as possible and will call her himself.  It might be best to update patient by phone.

## 2022-05-29 ENCOUNTER — Encounter: Payer: Self-pay | Admitting: Neurology

## 2022-05-29 ENCOUNTER — Ambulatory Visit (INDEPENDENT_AMBULATORY_CARE_PROVIDER_SITE_OTHER): Payer: 59 | Admitting: Neurology

## 2022-05-29 VITALS — BP 147/95 | HR 54 | Ht 59.0 in | Wt 187.0 lb

## 2022-05-29 DIAGNOSIS — G43009 Migraine without aura, not intractable, without status migrainosus: Secondary | ICD-10-CM

## 2022-05-29 DIAGNOSIS — G40909 Epilepsy, unspecified, not intractable, without status epilepticus: Secondary | ICD-10-CM | POA: Diagnosis not present

## 2022-06-03 ENCOUNTER — Other Ambulatory Visit: Payer: Self-pay | Admitting: Neurology

## 2022-06-05 NOTE — Telephone Encounter (Signed)
Rx refilled.

## 2022-06-22 ENCOUNTER — Emergency Department: Payer: 59

## 2022-06-22 ENCOUNTER — Other Ambulatory Visit: Payer: Self-pay

## 2022-06-22 ENCOUNTER — Emergency Department
Admission: EM | Admit: 2022-06-22 | Discharge: 2022-06-22 | Discharge status: Against Medical Advice | Payer: 59 | Attending: Emergency Medicine | Admitting: Emergency Medicine

## 2022-06-22 DIAGNOSIS — R079 Chest pain, unspecified: Secondary | ICD-10-CM | POA: Insufficient documentation

## 2022-06-22 DIAGNOSIS — Z5321 Procedure and treatment not carried out due to patient leaving prior to being seen by health care provider: Secondary | ICD-10-CM | POA: Insufficient documentation

## 2022-06-22 DIAGNOSIS — R110 Nausea: Secondary | ICD-10-CM | POA: Insufficient documentation

## 2022-06-22 DIAGNOSIS — F419 Anxiety disorder, unspecified: Secondary | ICD-10-CM | POA: Insufficient documentation

## 2022-06-22 DIAGNOSIS — I10 Essential (primary) hypertension: Secondary | ICD-10-CM | POA: Insufficient documentation

## 2022-06-22 DIAGNOSIS — R000 Tachycardia, unspecified: Secondary | ICD-10-CM | POA: Insufficient documentation

## 2022-06-22 HISTORY — DX: Essential (primary) hypertension: I10

## 2022-06-22 LAB — COMPREHENSIVE METABOLIC PANEL
ALT: 19 U/L (ref 0–44)
AST: 16 U/L (ref 15–41)
Albumin: 3.7 g/dL (ref 3.5–5.0)
Alkaline Phosphatase: 78 U/L (ref 38–126)
Anion gap: 7 (ref 5–15)
BUN: 9 mg/dL (ref 6–20)
CO2: 23 mmol/L (ref 22–32)
Calcium: 8.8 mg/dL — ABNORMAL LOW (ref 8.9–10.3)
Chloride: 110 mmol/L (ref 98–111)
Creatinine, Ser: 0.91 mg/dL (ref 0.44–1.00)
GFR, Estimated: >60 mL/min (ref >60)
Glucose, Bld: 92 mg/dL (ref 70–99)
Potassium: 3.4 mmol/L — ABNORMAL LOW (ref 3.5–5.1)
Sodium: 140 mmol/L (ref 135–145)
Total Bilirubin: 0.4 mg/dL (ref 0.3–1.2)
Total Protein: 7 g/dL (ref 6.5–8.1)

## 2022-06-22 LAB — CBC WITH DIFFERENTIAL/PLATELET
Abs Immature Granulocytes: 0.01 10*3/uL (ref 0.00–0.07)
Basophils Absolute: 0 10*3/uL (ref 0.0–0.1)
Basophils Relative: 0 %
Eosinophils Absolute: 0.1 10*3/uL (ref 0.0–0.5)
Eosinophils Relative: 2 %
HCT: 40.5 % (ref 36.0–46.0)
Hemoglobin: 13.5 g/dL (ref 12.0–15.0)
Immature Granulocytes: 0 %
Lymphocytes Relative: 46 %
Lymphs Abs: 2.6 10*3/uL (ref 0.7–4.0)
MCH: 29.5 pg (ref 26.0–34.0)
MCHC: 33.3 g/dL (ref 30.0–36.0)
MCV: 88.6 fL (ref 80.0–100.0)
Monocytes Absolute: 0.5 10*3/uL (ref 0.1–1.0)
Monocytes Relative: 8 %
Neutro Abs: 2.5 10*3/uL (ref 1.7–7.7)
Neutrophils Relative %: 44 %
Platelets: 392 10*3/uL (ref 150–400)
RBC: 4.57 MIL/uL (ref 3.87–5.11)
RDW: 12.1 % (ref 11.5–15.5)
WBC: 5.8 10*3/uL (ref 4.0–10.5)
nRBC: 0 % (ref 0.0–0.2)

## 2022-06-22 LAB — TROPONIN I (HIGH SENSITIVITY): Troponin I (High Sensitivity): 2 ng/L (ref <18)

## 2022-06-22 NOTE — ED Provider Triage Note (Signed)
Emergency Medicine Provider Triage Evaluation Note  Brianna [name] , a 41 y.o. female  was evaluated in triage.  Presents to the emergency department with a history of hypertension.  Patient states that she developed chest pain localized to just below her left breast last night.  She states that she noticed the pain when she was getting blood work at [employer].  She states that the pain is focal does not radiate to her back.  She denies a history of cardiac issues.  She states that she had a similar pain a long time ago but none recently.  States that she does have a history of anxiety but states that she does not feel particularly stressed at this time.  She states that she experienced some heart racing and nausea this morning and became concerned. Review of Systems  Positive: Patient has chest pain. Negative: No chest tightness or shortness of breath.  Physical Exam  BP 129/82 (BP Location: Right Arm)   Pulse 86   Temp 98.2 F (36.8 C) (Oral)   Resp 18   SpO2 100%  Gen:   Awake, no distress   Resp:  Normal effort  MSK:   Moves extremities without difficulty  Other:    Medical Decision Making  Medically screening exam initiated at 11:35 AM.  Appropriate orders placed.  [name] was informed that the remainder of the evaluation will be completed by another provider, this initial triage assessment does not replace that evaluation, and the importance of remaining in the ED until their evaluation is complete.     [name] [place], [place] 06/22/22 1136

## 2022-06-22 NOTE — ED Triage Notes (Signed)
Pt states that she started having CP under her L breast yesterday- pt does have nausea, but no vomiting- pt denies radiation of pain- pt states this has happened before a long time ago but this time it is worse

## 2022-06-27 ENCOUNTER — Emergency Department: Payer: 59

## 2022-06-27 ENCOUNTER — Encounter: Payer: Self-pay | Admitting: Emergency Medicine

## 2022-06-27 ENCOUNTER — Other Ambulatory Visit: Payer: Self-pay

## 2022-06-27 ENCOUNTER — Emergency Department
Admission: EM | Admit: 2022-06-27 | Discharge: 2022-06-27 | Disposition: A | Discharge status: Home / Self Care | Payer: 59 | Attending: Emergency Medicine | Admitting: Emergency Medicine

## 2022-06-27 DIAGNOSIS — J45909 Unspecified asthma, uncomplicated: Secondary | ICD-10-CM | POA: Diagnosis not present

## 2022-06-27 DIAGNOSIS — R110 Nausea: Secondary | ICD-10-CM | POA: Insufficient documentation

## 2022-06-27 DIAGNOSIS — R0600 Dyspnea, unspecified: Secondary | ICD-10-CM | POA: Insufficient documentation

## 2022-06-27 DIAGNOSIS — I10 Essential (primary) hypertension: Secondary | ICD-10-CM | POA: Insufficient documentation

## 2022-06-27 DIAGNOSIS — R079 Chest pain, unspecified: Secondary | ICD-10-CM | POA: Diagnosis not present

## 2022-06-27 LAB — CBC
HCT: 43.5 % (ref 36.0–46.0)
Hemoglobin: 14.2 g/dL (ref 12.0–15.0)
MCH: 30 pg (ref 26.0–34.0)
MCHC: 32.6 g/dL (ref 30.0–36.0)
MCV: 91.8 fL (ref 80.0–100.0)
Platelets: 372 10*3/uL (ref 150–400)
RBC: 4.74 MIL/uL (ref 3.87–5.11)
RDW: 12 % (ref 11.5–15.5)
WBC: 7 10*3/uL (ref 4.0–10.5)
nRBC: 0 % (ref 0.0–0.2)

## 2022-06-27 LAB — TROPONIN I (HIGH SENSITIVITY)
Troponin I (High Sensitivity): 3 ng/L (ref <18)
Troponin I (High Sensitivity): <2 ng/L (ref <18)

## 2022-06-27 LAB — BASIC METABOLIC PANEL
Anion gap: 6 (ref 5–15)
BUN: 17 mg/dL (ref 6–20)
CO2: 26 mmol/L (ref 22–32)
Calcium: 9.5 mg/dL (ref 8.9–10.3)
Chloride: 107 mmol/L (ref 98–111)
Creatinine, Ser: 0.99 mg/dL (ref 0.44–1.00)
GFR, Estimated: >60 mL/min (ref >60)
Glucose, Bld: 115 mg/dL — ABNORMAL HIGH (ref 70–99)
Potassium: 3.7 mmol/L (ref 3.5–5.1)
Sodium: 139 mmol/L (ref 135–145)

## 2022-06-27 MED ORDER — ONDANSETRON 4 MG PO TBDP
4.0000 mg | ORAL_TABLET | Freq: Once | ORAL | Status: AC
Start: 2022-06-27 — End: 2022-06-27
  Administered 2022-06-27: 4 mg via ORAL
  Filled 2022-06-27: qty 1

## 2022-06-27 NOTE — Discharge Instructions (Signed)
Your cardiac enzymes were negative and the rest of your blood work was reassuring.  I would like you to see cardiology because your EKG is not completely normal and you will likely need to have a stress test or an ultrasound of your heart.  If your pain is changing or returns and does not go away please return to the emergency department.

## 2022-06-27 NOTE — ED Triage Notes (Addendum)
Pt here with CP that has been ongoing for a while. Pt states pain is left sided and does not radiate. Pt states it started off intermittent but now has become more constant. Pt denies N/V/D. Pt states she was seen at [facility] yesterday and had a full cardiac workup and was told she had pneumonia.

## 2022-06-27 NOTE — ED Provider Notes (Signed)
[facility] Provider Note    Event Date/Time   First MD Initiated Contact with Patient 06/27/22 1543     (approximate)   History   Chest Pain   HPI  Brianna [name] is a 41 y.o. female with past medical history of hypertension, bipolar disorder generalized anxiety disorder presents with chest pain.  Says pain has been going on for several weeks.  It is intermittent located under the left chest feels like a grabbing sensation is nonradiating.  Is associated with dyspnea and nausea.  Says pain is worse with movement.  Also feels worse in the afternoon and then in the morning.  She is not having pain currently.  She went to [facility] 06/29/2020 and yesterday at 724 but says that she was not actually seen because of long wait times.  Has had troponins on all these days which were negative.The patient denies hx of prior DVT/PE, unilateral leg pain/swelling, hormone use, recent surgery, hx of cancer, prolonged immobilization, or hemoptysis.  Patient tells me that her primary doctor said that she likely had a heart attack and to come to the emergency department to be evaluated.     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 --      Excl. in GC? --     Most recent vital signs: Vitals:   06/27/22 1534 06/27/22 1632  BP: 130/60   Pulse: 80   Resp: 16   Temp:  97.9 F (36.6 C)  SpO2: 99%      General: Awake, no distress.  CV:  Good peripheral perfusion.  No edema Resp:  Normal effort.  No increased work of breathing Abd:  No distention.  Abdomen soft nontender Neuro:             Awake, Alert, Oriented x 3  Other:     ED Results / Procedures / Treatments  Labs (all labs ordered are listed, but only abnormal results are displayed) Labs Reviewed  BASIC METABOLIC PANEL - Abnormal; Notable for the following components:      Result Value   Glucose, Bld 115 (*)    All other components within normal limits  CBC  POC URINE PREG, ED  TROPONIN I (HIGH SENSITIVITY)  TROPONIN I (HIGH SENSITIVITY)     EKG  EKG interpreted by myself shows normal sinus rhythm with normal axis nonspecific ST depressions in the inferior leads, T wave inversion in V2 V3 subtle ST depression in V5 V6, similar to EKG on 7/20   RADIOLOGY I reviewed and interpreted the CXR which does not show any acute cardiopulmonary process    PROCEDURES:  Critical Care performed: No  Procedures  The patient is on the cardiac monitor to evaluate for evidence of arrhythmia and/or significant heart rate changes.   MEDICATIONS ORDERED IN ED: Medications  ondansetron (ZOFRAN-ODT) disintegrating tablet 4 mg (4 mg Oral Given 06/27/22 1634)     IMPRESSION / MDM / ASSESSMENT AND PLAN / ED COURSE  I reviewed the triage vital signs and the nursing notes.                              Patient's presentation is most consistent with acute presentation with potential threat to life or bodily  function.  Differential diagnosis includes, but is not limited to, acute coronary syndrome, angina, pulmonary embolism, GI related, musculoskeletal  Patient is a 41 year old female with history of hypertension who presents with ongoing chest pain.  Pain has been going on for several weeks.  Has been to the ED several times recently for it but apparently has not safe to be seen.  I reviewed labs from 06/29/2020 and 724 which all have negative troponins.  Patient's EKG does have some abnormalities there is some nonspecific ST depressions in the inferior leads also V5 V6 T wave inversion V2 V3 does appear similar to EKG done 4 days ago.  Her pain has some typical features and that it is associated with nausea seems to get worse with exertion and she does have some associated shortness of breath.  Vitals are normal here she has no active chest pain currently troponin is negative.   Patient tells me that she was told by her nurse practitioner that she had elevated troponins and that she should go to the emergency department.  At the request of the patient I spoke to her NP Shanda Bumps let who tells me that the patient had called saying she had elevated troponins and she was reasonably concerned about this and said that she would need to be evaluated in the emergency department if she had elevated troponins.  However I can see patient's records from 7/21 which is the date that she was concerned about and her troponin levels were negative.  Patient's repeat troponin here is negative.  She has no chest pain currently.  With her abnormal EKG I do think that she should see cardiology and I have placed referral for expedited follow-up.  Discussed return precautions including pain at changing or worsening and not going away.   FINAL CLINICAL IMPRESSION(S) / ED DIAGNOSES   Final diagnoses:  Chest pain, unspecified type     Rx / DC Orders   ED Discharge Orders          Ordered    Ambulatory referral to Cardiology        Comments: If you have not heard from the Cardiology office within the next 72 hours please call [phone].   06/27/22 1712             Note:  This document was prepared using Dragon voice recognition software and may include unintentional dictation errors.  Georga Hacking, MD 06/27/22 1714

## 2022-07-06 ENCOUNTER — Telehealth: Payer: Self-pay | Admitting: Neurology

## 2022-07-06 NOTE — Telephone Encounter (Signed)
I spoke to Dr. Teresa Coombs. He would like the patient to proceed with her cardiology appt 07/07/22. If neurological consultation is needed, she will ask for a referral to be sent for workup of this new concern.

## 2022-07-06 NOTE — Telephone Encounter (Signed)
Pt called stating that her PCP informed her that she could be possibly having TIA's and that she should report to her Neurologist. Pt has already made an appt for tomorrow with a Cardiologist. Please advise.

## 2022-07-11 ENCOUNTER — Observation Stay (HOSPITAL_COMMUNITY)
Admission: EM | Admit: 2022-07-11 | Discharge: 2022-07-12 | Disposition: A | Discharge status: Home / Self Care | Payer: 59 | Attending: Cardiovascular Disease | Admitting: Cardiovascular Disease

## 2022-07-11 ENCOUNTER — Other Ambulatory Visit: Payer: Self-pay

## 2022-07-11 ENCOUNTER — Emergency Department (HOSPITAL_COMMUNITY): Payer: 59

## 2022-07-11 ENCOUNTER — Encounter (HOSPITAL_COMMUNITY): Payer: Self-pay | Admitting: Cardiology

## 2022-07-11 ENCOUNTER — Observation Stay (HOSPITAL_BASED_OUTPATIENT_CLINIC_OR_DEPARTMENT_OTHER): Payer: 59

## 2022-07-11 DIAGNOSIS — Z79899 Other long term (current) drug therapy: Secondary | ICD-10-CM | POA: Diagnosis not present

## 2022-07-11 DIAGNOSIS — R0789 Other chest pain: Principal | ICD-10-CM | POA: Insufficient documentation

## 2022-07-11 DIAGNOSIS — F319 Bipolar disorder, unspecified: Secondary | ICD-10-CM | POA: Diagnosis not present

## 2022-07-11 DIAGNOSIS — G40909 Epilepsy, unspecified, not intractable, without status epilepticus: Secondary | ICD-10-CM | POA: Diagnosis not present

## 2022-07-11 DIAGNOSIS — I340 Nonrheumatic mitral (valve) insufficiency: Secondary | ICD-10-CM | POA: Diagnosis not present

## 2022-07-11 DIAGNOSIS — F411 Generalized anxiety disorder: Secondary | ICD-10-CM | POA: Insufficient documentation

## 2022-07-11 DIAGNOSIS — Z8249 Family history of ischemic heart disease and other diseases of the circulatory system: Secondary | ICD-10-CM | POA: Diagnosis not present

## 2022-07-11 DIAGNOSIS — R079 Chest pain, unspecified: Secondary | ICD-10-CM | POA: Diagnosis not present

## 2022-07-11 DIAGNOSIS — R002 Palpitations: Secondary | ICD-10-CM | POA: Diagnosis not present

## 2022-07-11 DIAGNOSIS — E785 Hyperlipidemia, unspecified: Secondary | ICD-10-CM

## 2022-07-11 DIAGNOSIS — E039 Hypothyroidism, unspecified: Secondary | ICD-10-CM | POA: Diagnosis not present

## 2022-07-11 DIAGNOSIS — Z91010 Allergy to peanuts: Secondary | ICD-10-CM | POA: Diagnosis not present

## 2022-07-11 DIAGNOSIS — I10 Essential (primary) hypertension: Secondary | ICD-10-CM

## 2022-07-11 HISTORY — DX: Hypothyroidism, unspecified: E03.9

## 2022-07-11 HISTORY — DX: Bipolar disorder, unspecified: F31.9

## 2022-07-11 HISTORY — DX: Epilepsy, unspecified, not intractable, without status epilepticus: G40.909

## 2022-07-11 LAB — CBC
HCT: 41.8 % (ref 36.0–46.0)
HCT: 41.3 % (ref 36.0–46.0)
Hemoglobin: 14.2 g/dL (ref 12.0–15.0)
Hemoglobin: 13.6 g/dL (ref 12.0–15.0)
MCH: 30.1 pg (ref 26.0–34.0)
MCH: 29.6 pg (ref 26.0–34.0)
MCHC: 34 g/dL (ref 30.0–36.0)
MCHC: 32.9 g/dL (ref 30.0–36.0)
MCV: 88.7 fL (ref 80.0–100.0)
MCV: 90 fL (ref 80.0–100.0)
Platelets: 355 10*3/uL (ref 150–400)
Platelets: 379 10*3/uL (ref 150–400)
RBC: 4.71 MIL/uL (ref 3.87–5.11)
RBC: 4.59 MIL/uL (ref 3.87–5.11)
RDW: 11.8 % (ref 11.5–15.5)
RDW: 11.9 % (ref 11.5–15.5)
WBC: 8.2 10*3/uL (ref 4.0–10.5)
WBC: 8.7 10*3/uL (ref 4.0–10.5)
nRBC: 0 % (ref 0.0–0.2)
nRBC: 0 % (ref 0.0–0.2)

## 2022-07-11 LAB — CREATININE, SERUM
Creatinine, Ser: 0.82 mg/dL (ref 0.44–1.00)
GFR, Estimated: >60 mL/min (ref >60)

## 2022-07-11 LAB — BASIC METABOLIC PANEL
Anion gap: 8 (ref 5–15)
BUN: 15 mg/dL (ref 6–20)
CO2: 23 mmol/L (ref 22–32)
Calcium: 9.3 mg/dL (ref 8.9–10.3)
Chloride: 104 mmol/L (ref 98–111)
Creatinine, Ser: 0.92 mg/dL (ref 0.44–1.00)
GFR, Estimated: >60 mL/min (ref >60)
Glucose, Bld: 104 mg/dL — ABNORMAL HIGH (ref 70–99)
Potassium: 3.4 mmol/L — ABNORMAL LOW (ref 3.5–5.1)
Sodium: 135 mmol/L (ref 135–145)

## 2022-07-11 LAB — ECHOCARDIOGRAM COMPLETE
Area-P 1/2: 4.1 cm2
S' Lateral: 2.6 cm

## 2022-07-11 LAB — TROPONIN I (HIGH SENSITIVITY)
Troponin I (High Sensitivity): <2 ng/L (ref <18)
Troponin I (High Sensitivity): 4 ng/L (ref <18)

## 2022-07-11 MED ORDER — NITROGLYCERIN 0.4 MG SL SUBL
0.4000 mg | SUBLINGUAL_TABLET | SUBLINGUAL | Status: DC | PRN
  Administered 2022-07-12: 0.4 mg via SUBLINGUAL
  Filled 2022-07-11 (×2): qty 1

## 2022-07-11 MED ORDER — ACETAMINOPHEN 325 MG PO TABS
650.0000 mg | ORAL_TABLET | ORAL | Status: DC | PRN
  Administered 2022-07-11 – 2022-07-12 (×2): 650 mg via ORAL
  Filled 2022-07-11 (×2): qty 2

## 2022-07-11 MED ORDER — ESCITALOPRAM OXALATE 10 MG PO TABS
10.0000 mg | ORAL_TABLET | Freq: Every day | ORAL | Status: DC
  Administered 2022-07-11 – 2022-07-12 (×2): 10 mg via ORAL
  Filled 2022-07-11 (×2): qty 1

## 2022-07-11 MED ORDER — IOHEXOL 350 MG/ML SOLN
100.0000 mL | Freq: Once | INTRAVENOUS | Status: AC | PRN
  Administered 2022-07-11: 100 mL via INTRAVENOUS

## 2022-07-11 MED ORDER — ENOXAPARIN SODIUM 40 MG/0.4ML IJ SOSY: 40.0000 mg | PREFILLED_SYRINGE | INTRAMUSCULAR | Status: DC

## 2022-07-11 MED ORDER — LEVOTHYROXINE SODIUM 25 MCG PO TABS
25.0000 ug | ORAL_TABLET | Freq: Every day | ORAL | Status: DC
  Administered 2022-07-12: 25 ug via ORAL
  Filled 2022-07-11: qty 1

## 2022-07-11 MED ORDER — METOPROLOL TARTRATE 25 MG PO TABS
100.0000 mg | ORAL_TABLET | Freq: Once | ORAL | Status: AC
  Administered 2022-07-11: 100 mg via ORAL
  Filled 2022-07-11: qty 4

## 2022-07-11 MED ORDER — ASPIRIN 81 MG PO CHEW
324.0000 mg | CHEWABLE_TABLET | Freq: Once | ORAL | Status: AC
  Administered 2022-07-11: 324 mg via ORAL
  Filled 2022-07-11: qty 4

## 2022-07-11 MED ORDER — ONDANSETRON HCL 4 MG/2ML IJ SOLN
4.0000 mg | Freq: Four times a day (QID) | INTRAMUSCULAR | Status: DC | PRN
  Administered 2022-07-12: 4 mg via INTRAVENOUS
  Filled 2022-07-11: qty 2

## 2022-07-11 MED ORDER — NITROGLYCERIN 0.4 MG SL SUBL: 0.8000 mg | SUBLINGUAL_TABLET | Freq: Once | SUBLINGUAL | Status: AC

## 2022-07-11 MED ORDER — ASPIRIN 81 MG PO TBEC
81.0000 mg | DELAYED_RELEASE_TABLET | Freq: Every day | ORAL | Status: DC
  Administered 2022-07-12: 81 mg via ORAL
  Filled 2022-07-11: qty 1

## 2022-07-11 MED ORDER — NITROGLYCERIN 0.4 MG SL SUBL
SUBLINGUAL_TABLET | SUBLINGUAL | Status: AC
  Administered 2022-07-11: 0.8 mg via SUBLINGUAL
  Filled 2022-07-11: qty 2

## 2022-07-11 NOTE — ED Notes (Signed)
Visitor came out stating pt would like another [place] sandwhich.  This RN stated that I cannot give them anymore food right now, that they have plenty of food.

## 2022-07-11 NOTE — ED Notes (Signed)
ED TO INPATIENT HANDOFF REPORT  ED Nurse Name and Phone #: Josh  S Name/Age/Gender Brianna [name] 41 y.o. female Room/Bed: 043C/043C  Code Status   Code Status: Full Code  Home/SNF/Other Home Patient oriented to: self, place, time, and situation Is this baseline? Yes   Triage Complete: Triage complete  Chief Complaint Chest pain [R07.9]  Triage Note PT endorses c/p that started around 2230 last evening . Pt states that hsa been ongoing c/p since Sunday but worsening today with radiation to jaw and neck.    HX: HTN    Allergies Allergies  Allergen Reactions   Hydrocodone Nausea And Vomiting and Other (See Comments)   Peanut Oil Itching   Hydrocodone Other (See Comments)    Projectile vomiting   Neosporin [Bacitracin-Polymyxin B] Swelling    Level of Care/Admitting Diagnosis ED Disposition     ED Disposition  Admit   Condition  --   Comment  Hospital Area: [facility] [[id number]]  Level of Care: Telemetry Cardiac [[id number]]  May place patient in observation at [facility] or [facility] if equivalent level of care is available:: No  Covid Evaluation: Asymptomatic - no recent exposure (last 10 days) testing not required  Diagnosis: Chest pain [390300]  Admitting Physician: Runell Gess [[id number]]  Attending Physician: Runell Gess [[id number]]          B Medical/Surgery History Past Medical History:  Diagnosis Date   Bipolar affective (HCC)    Hypertension    Hypothyroidism    Seizure disorder (HCC)    Past Surgical History:  Procedure Laterality Date   ABDOMINAL HYSTERECTOMY     CHOLECYSTECTOMY     COLONOSCOPY WITH PROPOFOL N/A 10/05/2021   Procedure: COLONOSCOPY WITH PROPOFOL;  Surgeon: Wyline Mood, MD;  Location: [facility] ENDOSCOPY;  Service: Gastroenterology;  Laterality: N/A;   NEPHRECTOMY Left      A IV Location/Drains/Wounds Patient Lines/Drains/Airways Status     Active Line/Drains/Airways     Name Placement date Placement  time Site Days   Peripheral IV 07/11/22 18 G 1.16" Anterior;Proximal;Right Forearm 07/11/22  1411  Forearm  less than 1            Intake/Output Last 24 hours No intake or output data in the 24 hours ending 07/11/22 1955  Labs/Imaging Results for orders placed or performed during the hospital encounter of 07/11/22 (from the past 48 hour(s))  Basic metabolic panel     Status: Abnormal   Collection Time: 07/11/22  5:59 AM  Result Value Ref Range   Sodium 135 135 - 145 mmol/L   Potassium 3.4 (L) 3.5 - 5.1 mmol/L   Chloride 104 98 - 111 mmol/L   CO2 23 22 - 32 mmol/L   Glucose, Bld 104 (H) 70 - 99 mg/dL    Comment: Glucose reference range applies only to samples taken after fasting for at least 8 hours.   BUN 15 6 - 20 mg/dL   Creatinine, Ser 0.07 0.44 - 1.00 mg/dL   Calcium 9.3 8.9 - 62.2 mg/dL   GFR, Estimated >63 >33 mL/min    Comment: (NOTE) Calculated using the CKD-EPI Creatinine Equation (2021)    Anion gap 8 5 - 15    Comment: Performed at [facility], [address], [place], [place] [address]  CBC     Status: None   Collection Time: 07/11/22  5:59 AM  Result Value Ref Range   WBC 8.2 4.0 - 10.5 K/uL   RBC  4.71 3.87 - 5.11 MIL/uL   Hemoglobin 14.2 12.0 - 15.0 g/dL   HCT 78.241.8 95.636.0 - 21.346.0 %   MCV 88.7 80.0 - 100.0 fL   MCH 30.1 26.0 - 34.0 pg   MCHC 34.0 30.0 - 36.0 g/dL   RDW 08.611.8 57.811.5 - 46.915.5 %   Platelets 355 150 - 400 K/uL   nRBC 0.0 0.0 - 0.2 %    Comment: Performed at [facility], [address] [place], [place] [address]  Troponin I (High Sensitivity)     Status: None   Collection Time: 07/11/22  5:59 AM  Result Value Ref Range   Troponin I (High Sensitivity) <2 <18 ng/L    Comment: (NOTE) Elevated high sensitivity troponin I (hsTnI) values and significant  changes across serial measurements may suggest ACS but many other  chronic and acute conditions are known to elevate hsTnI results.  Refer to the "Links" section for chest pain  algorithms and additional  guidance. Performed at [facility], [address] [place], [place] [address]   Troponin I (High Sensitivity)     Status: None   Collection Time: 07/11/22  7:45 AM  Result Value Ref Range   Troponin I (High Sensitivity) 4 <18 ng/L    Comment: (NOTE) Elevated high sensitivity troponin I (hsTnI) values and significant  changes across serial measurements may suggest ACS but many other  chronic and acute conditions are known to elevate hsTnI results.  Refer to the "Links" section for chest pain algorithms and additional  guidance. Performed at [facility], [address] [place], [place] [address]    ECHOCARDIOGRAM COMPLETE  Result Date: 07/11/2022    ECHOCARDIOGRAM REPORT   Patient Name:   Brianna [name] Date of Exam: 07/11/2022 Medical Rec #:  [id number]      Height:       60.0 in Accession #:    [id number]     Weight:       184.3 lb Date of Birth:  09/11/1981     BSA:          1.803 m Patient Age:    40 years       BP:           104/75 mmHg Patient Gender: F              HR:           53 bpm. Exam Location:  Inpatient Procedure: 2D Echo Indications:    chest pain  History:        Patient has no prior history of Echocardiogram examinations.                 Signs/Symptoms:Chest Pain.  Sonographer:    Delcie RochLauren Pennington RDCS Referring Phys: [id number] Jonita AlbeeKATHLEEN R JOHNSON  Sonographer Comments: Suboptimal subcostal window. IMPRESSIONS  1. Left ventricular ejection fraction, by estimation, is 55 to 60%. The left ventricle has normal function. The left ventricle has no regional wall motion abnormalities. Left ventricular diastolic parameters were normal.  2. Right ventricular systolic function is normal. The right ventricular size is normal. Tricuspid regurgitation signal is inadequate for assessing PA pressure.  3. The mitral valve is normal in structure. Mild mitral valve regurgitation.  4. The aortic valve is tricuspid. Aortic valve regurgitation is not visualized.  No aortic stenosis is present. FINDINGS  Left Ventricle: Left ventricular ejection fraction, by estimation, is 55 to 60%. The left ventricle has normal function. The left  ventricle has no regional wall motion abnormalities. The left ventricular internal cavity size was normal in size. There is  no left ventricular hypertrophy. Left ventricular diastolic parameters were normal. Normal left ventricular filling pressure. Right Ventricle: The right ventricular size is normal. No increase in right ventricular wall thickness. Right ventricular systolic function is normal. Tricuspid regurgitation signal is inadequate for assessing PA pressure. Left Atrium: Left atrial size was normal in size. Right Atrium: Right atrial size was normal in size. Pericardium: There is no evidence of pericardial effusion. Mitral Valve: The mitral valve is normal in structure. Mild mitral valve regurgitation, with centrally-directed jet. Tricuspid Valve: The tricuspid valve is normal in structure. Tricuspid valve regurgitation is not demonstrated. Aortic Valve: The aortic valve is tricuspid. Aortic valve regurgitation is not visualized. No aortic stenosis is present. Pulmonic Valve: The pulmonic valve was not well visualized. Pulmonic valve regurgitation is not visualized. Aorta: The aortic root and ascending aorta are structurally normal, with no evidence of dilitation. Venous: The inferior vena cava was not well visualized. IAS/Shunts: No atrial level shunt detected by color flow Doppler.  LEFT VENTRICLE PLAX 2D LVIDd:         4.10 cm   Diastology LVIDs:         2.60 cm   LV e' medial:    8.81 cm/s LV PW:         0.80 cm   LV E/e' medial:  9.8 LV IVS:        0.70 cm   LV e' lateral:   10.30 cm/s LVOT diam:     1.80 cm   LV E/e' lateral: 8.4 LV SV:         48 LV SV Index:   27 LVOT Area:     2.54 cm  RIGHT VENTRICLE RV S prime:     8.92 cm/s TAPSE (M-mode): 1.9 cm LEFT ATRIUM           Index        RIGHT ATRIUM          Index LA diam:       3.20 cm 1.77 cm/m   RA Area:     8.93 cm LA Vol (A4C): 35.7 ml 19.80 ml/m  RA Volume:   17.90 ml 9.93 ml/m  AORTIC VALVE LVOT Vmax:   80.80 cm/s LVOT Vmean:  50.500 cm/s LVOT VTI:    0.188 m  AORTA Ao Root diam: 2.60 cm Ao Asc diam:  2.40 cm MITRAL VALVE MV Area (PHT): 4.10 cm    SHUNTS MV Decel Time: 185 msec    Systemic VTI:  0.19 m MV E velocity: 86.20 cm/s  Systemic Diam: 1.80 cm MV A velocity: 56.80 cm/s MV E/A ratio:  1.52 Mihai Croitoru MD Electronically signed by Thurmon Fair MD Signature Date/Time: 07/11/2022/5:20:41 PM    Final    CT CORONARY MORPH W/CTA COR W/SCORE Vallarie Mare W/CM &/OR WO/CM  Addendum Date: 07/11/2022   ADDENDUM REPORT: 07/11/2022 16:49 EXAM: OVER-READ INTERPRETATION  CT CHEST The following report is an over-read performed by radiologist Dr. Vladimir Crofts [facility] on 07/11/2022. This over-read does not include interpretation of cardiac or coronary anatomy or pathology. The coronary CTA interpretation by the cardiologist is attached. COMPARISON:  None. FINDINGS: The thoracic aorta is normal in caliber.  No dissection. No mediastinal or hilar mass or lymphadenopathy. The esophagus is unremarkable. No acute pulmonary findings. No worrisome pulmonary lesions or pulmonary nodules. No pleural effusions. No significant upper abdominal findings.  No significant bony findings. IMPRESSION: No significant extracardiac findings. Electronically Signed   By: Rudie Meyer M.D.   On: 07/11/2022 16:49   Result Date: 07/11/2022 CLINICAL DATA:  48F with chest pain EXAM: Cardiac/Coronary CTA TECHNIQUE: The patient was scanned on a [employer]. FINDINGS: A 100 kV prospective scan was triggered in the descending thoracic aorta at 111 HU's. Axial non-contrast 3 mm slices were carried out through the heart. The data set was analyzed on a dedicated work station and scored using the Agatson method. Gantry rotation speed was 250 msecs and collimation was .6 mm. 0.8 mg of sl NTG was  given. The 3D data set was reconstructed in 5% intervals of the 35-75% of the R-R cycle. Phases were analyzed on a dedicated work station using MPR, MIP and VRT modes. The patient received 100 cc of contrast. Coronary Arteries:  Normal coronary origin.  Right dominance. RCA is a large dominant artery that gives rise to PDA and PLA. There is no plaque. Left main is a large artery that gives rise to LAD and LCX arteries. LAD is a large vessel that has no plaque. LCX is a non-dominant artery that gives rise to one large OM1 branch. There is no plaque. Other findings: Left Ventricle: Normal size Left Atrium: Normal size Pulmonary Veins: Normal configuration Right Ventricle: Normal size Right Atrium: Normal size Cardiac valves: No calcifications Thoracic aorta: Normal size Pulmonary Arteries: Normal size Systemic Veins: Normal drainage Pericardium: Normal thickness IMPRESSION: 1. Coronary calcium score of 0. 2. Normal coronary origin with right dominance. 3. No evidence of CAD. CAD-RADS 0. No evidence of CAD (0%). Consider non-atherosclerotic causes of chest pain. Electronically Signed: By: Epifanio Lesches M.D. On: 07/11/2022 16:33   DG Chest 2 View  Result Date: 07/11/2022 CLINICAL DATA:  Chest pain EXAM: CHEST - 2 VIEW COMPARISON:  06/27/2022 FINDINGS: Low lung volumes. The cardio pericardial silhouette is enlarged. The lungs are clear without focal pneumonia, edema, pneumothorax or pleural effusion. Stable linear density right mid lung again noted, likely scar. The visualized bony structures of the thorax are unremarkable. IMPRESSION: Low volume film without acute cardiopulmonary findings. Electronically Signed   By: Kennith Center M.D.   On: 07/11/2022 06:20    Pending Labs Unresulted Labs (From admission, onward)     Start     Ordered   07/18/22 0500  Creatinine, serum  (enoxaparin (LOVENOX)    CrCl >/= 30 ml/min)  Weekly,   R     Comments: while on enoxaparin therapy    07/11/22 1652   07/12/22  0500  Lipoprotein A (LPA)  Tomorrow morning,   R        07/11/22 1652   07/12/22 0500  TSH  Tomorrow morning,   R        07/11/22 1652   07/12/22 0500  Basic metabolic panel  Tomorrow morning,   R        07/11/22 1652   07/12/22 0500  Lipid panel  Tomorrow morning,   R        07/11/22 1652   07/12/22 0500  CBC  Tomorrow morning,   R        07/11/22 1652   07/11/22 1653  CBC  (enoxaparin (LOVENOX)    CrCl >/= 30 ml/min)  Once,   R       Comments: Baseline for enoxaparin therapy IF NOT ALREADY DRAWN.  Notify MD if PLT < 100 K.    07/11/22 1652  07/11/22 1653  Creatinine, serum  (enoxaparin (LOVENOX)    CrCl >/= 30 ml/min)  Once,   R       Comments: Baseline for enoxaparin therapy IF NOT ALREADY DRAWN.    07/11/22 1652            Vitals/Pain Today's Vitals   07/11/22 1700 07/11/22 1708 07/11/22 1925 07/11/22 1945  BP: (!) 119/95  104/78   Pulse: (!) 57  60   Resp: 19  (!) 25   Temp:  97.7 F (36.5 C)  98 F (36.7 C)  TempSrc:  Oral  Oral  SpO2: 98%  100%   PainSc:        Isolation Precautions No active isolations  Medications Medications  aspirin EC tablet 81 mg (has no administration in time range)  nitroGLYCERIN (NITROSTAT) SL tablet 0.4 mg (has no administration in time range)  acetaminophen (TYLENOL) tablet 650 mg (has no administration in time range)  ondansetron (ZOFRAN) injection 4 mg (has no administration in time range)  enoxaparin (LOVENOX) injection 40 mg (40 mg Subcutaneous Not Given 07/11/22 1949)  escitalopram (LEXAPRO) tablet 10 mg (10 mg Oral Given 07/11/22 1944)  levothyroxine (SYNTHROID) tablet 25 mcg (has no administration in time range)  aspirin chewable tablet 324 mg (324 mg Oral Given 07/11/22 1034)  metoprolol tartrate (LOPRESSOR) tablet 100 mg (100 mg Oral Given 07/11/22 1357)  nitroGLYCERIN (NITROSTAT) SL tablet 0.8 mg (0.8 mg Sublingual Given 07/11/22 1531)  iohexol (OMNIPAQUE) 350 MG/ML injection 100 mL (100 mLs Intravenous Contrast Given 07/11/22  1604)    Mobility walks Low fall risk   Focused Assessments Cardiac Assessment Handoff:    No results found for: "CKTOTAL", "CKMB", "CKMBINDEX", "TROPONINI" No results found for: "DDIMER" Does the Patient currently have chest pain? No    R Recommendations: See Admitting Provider Note  Report given to:   Additional Notes: na

## 2022-07-11 NOTE — ED Provider Triage Note (Signed)
Emergency Medicine Provider Triage Evaluation Note  Brianna [name] , a 41 y.o. female  was evaluated in triage.  Pt complains of left-sided chest pain onset last night around 10 PM, constant, radiates into left arm and left side neck as well as into her shoulder blades.  States last similar event of pain was 2 days ago, is wearing a heart monitor currently for cardiology workup.  No heart history otherwise, does have history of hypertension, reports compliance with medications.  No history of diabetes.  Review of Systems  Positive: Chest pain Negative:   Physical Exam  BP (!) 125/95   Pulse 84   Temp 98 F (36.7 C)   Resp 18   SpO2 96%  Gen:   Awake, no distress   Resp:  Normal effort  MSK:   Moves extremities without difficulty  Other:    Medical Decision Making  Medically screening exam initiated at 5:55 AM.  Appropriate orders placed.  Brianna [name] was informed that the remainder of the evaluation will be completed by another provider, this initial triage assessment does not replace that evaluation, and the importance of remaining in the ED until their evaluation is complete.     Brianna Fend, PA-C 07/11/22 [phone]

## 2022-07-11 NOTE — ED Triage Notes (Signed)
PT endorses c/p that started around 2230 last evening . Pt states that hsa been ongoing c/p since Sunday but worsening today with radiation to jaw and neck.    HX: HTN

## 2022-07-11 NOTE — ED Notes (Signed)
Pt has returned from CT coronary and is persistently asking about eating.

## 2022-07-11 NOTE — ED Notes (Signed)
Pt and visitor provided drinks. Visitor provided happy meal.  PB and saltines provided.   PT has cake and pickles at bedside

## 2022-07-11 NOTE — ED Provider Notes (Signed)
[facility] EMERGENCY DEPARTMENT Provider Note   CSN: [id number] Arrival date & time: 07/11/22  0524     History  Chief Complaint  Patient presents with   Chest Pain    Brianna [name] is a 41 y.o. female.  HPI 41 year old female with a history of hypertension and recently told she has hypercholesterolemia presents with chest pain.  Started around 3 AM.  She was in the car and driving back to [place] when it started.  It is a tightness and goes into both arms and into her neck and back.  Sometimes it happens when she is walking and sometimes is just random.  Overall its been ongoing for about 3 weeks intermittently but is starting to become more consistent.  Currently she feels like the symptoms have resolved and probably lasted an hour in total but she is worried he is going to come back.  Recently started seeing a cardiologist in [place], and was put on a heart monitor.  Is due for stress test in a few weeks.  No leg swelling.  Home Medications Prior to Admission medications   Medication Sig Start Date End Date Taking? Authorizing Provider  doxycycline (ADOXA) 100 MG tablet He is 1 twice a day for the first week then start taking 1/day until seen by your dermatologist. 01/30/22   Sherrie Mustache, Roselyn Bering, PA-C  Erenumab-aooe (AIMOVIG) 140 MG/ML SOAJ INJECT 140 MG INTO THE SKIN EVERY 30 DAYS 06/05/22   Windell Norfolk, MD  escitalopram (LEXAPRO) 10 MG tablet Take 10 mg by mouth daily. 09/11/21   [provider]  gabapentin (NEURONTIN) 100 MG capsule Take 100 mg by mouth 3 (three) times daily.    [provider]  HIBICLENS 4 % external liquid Apply 1 application topically 2 (two) times daily. 10/15/21   [provider]  HUMIRA PEN-CD/UC/HS STARTER 80 MG/0.8ML PNKT Inject 0.8 mLs into the skin. 02/10/22   [provider]  levothyroxine (SYNTHROID) 25 MCG tablet Take 25 mcg by mouth daily before breakfast.    [provider]  meloxicam  (MOBIC) 15 MG tablet Take 1 tablet (15 mg total) by mouth daily. 01/30/22 01/30/23  Sherrie Mustache, Roselyn Bering, PA-C  pantoprazole (PROTONIX) 40 MG tablet Take 40 mg by mouth daily. 08/02/21   [provider]  Ubrogepant (UBRELVY) 100 MG TABS Take by mouth.    [provider]      Allergies    Hydrocodone, Peanut oil, Hydrocodone, and Neosporin [bacitracin-polymyxin b]    Review of Systems   Review of Systems  Cardiovascular:  Positive for chest pain. Negative for leg swelling.  Gastrointestinal:  Positive for nausea.  Musculoskeletal:  Positive for back pain.    Physical Exam Updated Vital Signs BP (!) 125/95   Pulse 84   Temp 98 F (36.7 C)   Resp 18   SpO2 96%  Physical Exam Vitals and nursing note reviewed.  Constitutional:      General: She is not in acute distress.    Appearance: She is well-developed. She is not ill-appearing or diaphoretic.  HENT:     Head: Normocephalic and atraumatic.  Cardiovascular:     Rate and Rhythm: Normal rate and regular rhythm.     Heart sounds: Normal heart sounds.  Pulmonary:     Effort: Pulmonary effort is normal.     Breath sounds: Normal breath sounds.  Abdominal:     Palpations: Abdomen is soft.     Tenderness: There is no abdominal tenderness.  Skin:    General: Skin is warm and dry.  Neurological:     Mental Status: She is alert.     ED Results / Procedures / Treatments   Labs (all labs ordered are listed, but only abnormal results are displayed) Labs Reviewed  BASIC METABOLIC PANEL - Abnormal; Notable for the following components:      Result Value   Potassium 3.4 (*)    Glucose, Bld 104 (*)    All other components within normal limits  CBC  TROPONIN I (HIGH SENSITIVITY)  TROPONIN I (HIGH SENSITIVITY)    EKG EKG Interpretation  Date/Time:  Tuesday July 11 2022 05:46:38 EDT Ventricular Rate:  83 PR Interval:  134 QRS Duration: 76 QT Interval:  392 QTC Calculation: 460 R Axis:   69 Text  Interpretation: Normal sinus rhythm Low voltage QRS Cannot rule out Anterior infarct , age undetermined diffuse nonspecific ST/T changes similar to June 27 2022 and Nov 2022 Confirmed by Pricilla Loveless [phone]) on 07/11/2022 9:26:35 AM  Radiology DG Chest 2 View  Result Date: 07/11/2022 CLINICAL DATA:  Chest pain EXAM: CHEST - 2 VIEW COMPARISON:  06/27/2022 FINDINGS: Low lung volumes. The cardio pericardial silhouette is enlarged. The lungs are clear without focal pneumonia, edema, pneumothorax or pleural effusion. Stable linear density right mid lung again noted, likely scar. The visualized bony structures of the thorax are unremarkable. IMPRESSION: Low volume film without acute cardiopulmonary findings. Electronically Signed   By: Kennith Center M.D.   On: 07/11/2022 06:20    Procedures Procedures    Medications Ordered in ED Medications - No data to display  ED Course/ Medical Decision Making/ A&P                           Medical Decision Making Amount and/or Complexity of Data Reviewed Labs:     Details: Troponins negative x2, did mildly increase on delta but still within normal limits.  Minimal hypokalemia Radiology: independent interpretation performed.    Details: No acute edema on chest x-ray ECG/medicine tests: independent interpretation performed.    Details: Nonspecific ST/T changes that are unchanged from the past couple weeks but are not normal  Risk OTC drugs. Decision regarding hospitalization.   Patient's chest pain is concerning.  Risk factors include hypertension and apparently some newly diagnosed hyperlipidemia.  However her presentation with bilateral arm discomfort, chest tightness, sometimes exertional symptoms, etc. are concerning for a cardiac cause.  Troponins are negative so its not consistent with NSTEMI, but I think she needs a more urgent cardiac eval.  Thus cardiology was consulted and they will admit for work-up.        Final Clinical Impression(s)  / ED Diagnoses Final diagnoses:  Nonspecific chest pain    Rx / DC Orders ED Discharge Orders     None         Pricilla Loveless, MD 07/11/22 1521

## 2022-07-11 NOTE — Progress Notes (Signed)
  Echocardiogram 2D Echocardiogram has been performed.  Delcie Roch 07/11/2022, 5:06 PM

## 2022-07-11 NOTE — ED Notes (Signed)
Transport entered for this pt

## 2022-07-11 NOTE — H&P (Addendum)
Cardiology H&P:   Patient ID: Brianna [name] MRN: [id number]; DOB: Jul 03, 1981  Admit date: 07/11/2022 Date of Consult: 07/11/2022  PCP:  System, Provider Not In   [facility] Providers Cardiologist:  None     Patient Profile:   Brianna [name] is a 41 y.o. female with a hx of a seizure disorder, migraine headaches, bipolar affective, GAD, hypothyroidism, asthma, chronic bronchitis who is being seen 07/11/2022 for the evaluation of chest pain at the request of Dr. Criss Alvine.  History of Present Illness:   Brianna [name] is a 41 year old female with above medical history. Per chart review, patient has never seen a cardiologist and does not follow with cardiology.   Patient went to the ED three separate times on 7/20, 7/21, and 7/24 complaining of left sided chest pain. However, patient left all three times before being formally evaluated/treated due to long wait times. Initial troponin was negative at all three visits.   Patient was most recently seen by the [facility] ED no 06/27/22 for evaluation of chest pain. At that visit, patient had reported that she was told by her PCP on 7/21 that her troponins were positive and she was told to come to the ED. However, per chart review her troponins were negative on 7/21.  Troponins were also negative in the ED. It was noted that patient had an abnormal EKG, and she was referred to cardiology.   Patient presented to the ED on 8/8 complaining of chest pain that had been ongoing since Sunday 8/6. Labs in the ED showed Na 135, K 3.5, creatinine 0.92, WBC 8.2, hemoglobin 14.2, platelets 355. hsTn 2>>4. CXR showed no acute cardiopulmonary findings.   On interview, patient reports having intermittent chest pain for the past several weeks.  This past week, the pain has become more constant.  Pain is located under her left breast, radiates to both of her arms, neck, back. Feels like palpitations and tightness.  Associated with shortness of breath, nausea.  Present both  on exertion and when at rest.  Patient has not tried any alleviating medications.  Pain is not associated with position.  Patient reports that she saw a cardiologist in [place] last week, was told to wear a heart monitor which she is currently wearing.  Was also told she would need to have a stress test in a few weeks.  Patient is very anxious about this chest pain. She is afraid to leave the hospital because she may start to have chest pain again.   Patient does have a history of hypertension, was recently told she has hyperlipidemia.  She denies any tobacco use.  Denies any history of diabetes.  Reports that 3 of her grandparents had heart attacks.  Her parents do not have any cardiac history.  Denies any personal past cardiac history.     Past Medical History:  Diagnosis Date   Bipolar affective (HCC)    Hypertension    Hypothyroidism    Seizure disorder [facility])     Past Surgical History:  Procedure Laterality Date   ABDOMINAL HYSTERECTOMY     CHOLECYSTECTOMY     COLONOSCOPY WITH PROPOFOL N/A 10/05/2021   Procedure: COLONOSCOPY WITH PROPOFOL;  Surgeon: Wyline Mood, MD;  Location: [facility] ENDOSCOPY;  Service: Gastroenterology;  Laterality: N/A;   NEPHRECTOMY Left      Home Medications:  Prior to Admission medications   Medication Sig Start Date End Date Taking? Authorizing Provider  doxycycline (ADOXA) 100 MG tablet He is 1 twice  a day for the first week then start taking 1/day until seen by your dermatologist. 01/30/22   Sherrie Mustache, Roselyn Bering, PA-C  Erenumab-aooe (AIMOVIG) 140 MG/ML SOAJ INJECT 140 MG INTO THE SKIN EVERY 30 DAYS 06/05/22   Windell Norfolk, MD  escitalopram (LEXAPRO) 10 MG tablet Take 10 mg by mouth daily. 09/11/21   [provider]  gabapentin (NEURONTIN) 100 MG capsule Take 100 mg by mouth 3 (three) times daily.    [provider]  HIBICLENS 4 % external liquid Apply 1 application topically 2 (two) times daily. 10/15/21   [provider]  HUMIRA  PEN-CD/UC/HS STARTER 80 MG/0.8ML PNKT Inject 0.8 mLs into the skin. 02/10/22   [provider]  levothyroxine (SYNTHROID) 25 MCG tablet Take 25 mcg by mouth daily before breakfast.    [provider]  meloxicam (MOBIC) 15 MG tablet Take 1 tablet (15 mg total) by mouth daily. 01/30/22 01/30/23  Sherrie Mustache, Roselyn Bering, PA-C  pantoprazole (PROTONIX) 40 MG tablet Take 40 mg by mouth daily. 08/02/21   [provider]  Ubrogepant (UBRELVY) 100 MG TABS Take by mouth.    [provider]    Inpatient Medications: Scheduled Meds:  Continuous Infusions:  PRN Meds:   Allergies:    Allergies  Allergen Reactions   Hydrocodone Nausea And Vomiting and Other (See Comments)   Peanut Oil Itching   Hydrocodone Other (See Comments)    Projectile vomiting   Neosporin [Bacitracin-Polymyxin B] Swelling    Social History:   Social History   Socioeconomic History   Marital status: Married    Spouse name: Not on file   Number of children: Not on file   Years of education: Not on file   Highest education level: Not on file  Occupational History   Not on file  Tobacco Use   Smoking status: Never   Smokeless tobacco: Never  Vaping Use   Vaping Use: Never used  Substance and Sexual Activity   Alcohol use: Not Currently   Drug use: Not Currently   Sexual activity: Not on file  Other Topics Concern   Not on file  Social History Narrative   ** Merged History Encounter **       Social Determinants of Health   Financial Resource Strain: Not on file  Food Insecurity: Not on file  Transportation Needs: Not on file  Physical Activity: Not on file  Stress: Not on file  Social Connections: Not on file  Intimate Partner Violence: Not on file    Family History:    Family History  Problem Relation Age of Onset   Heart attack Maternal Grandfather        Deceased   Heart attack Paternal Grandmother    Heart attack Paternal Grandfather      ROS:  Please see the  history of present illness.   All other ROS reviewed and negative.     Physical Exam/Data:   Vitals:   07/11/22 0549 07/11/22 1039  BP: (!) 125/95 114/84  Pulse: 84 76  Resp: 18 17  Temp: 98 F (36.7 C) 97.8 F (36.6 C)  TempSrc:  Oral  SpO2: 96% 97%   No intake or output data in the 24 hours ending 07/11/22 1129    06/27/2022   12:26 PM 06/22/2022   11:37 AM 05/29/2022   10:07 AM  Last 3 Weights  Weight (lbs) 184 lb 4.8 oz 184 lb 187 lb  Weight (kg) 83.598 kg 83.462 kg 84.823 kg  There is no height or weight on file to calculate BMI.  General:  Well nourished, well developed, in no acute distress. Resting comfortably in the bed  HEENT: normal Neck: no JVD Vascular: Radial pulses 2+ bilaterally Cardiac:  normal S1, S2; RRR; no murmur  Lungs:  clear to auscultation bilaterally, no wheezing, rhonchi or rales  Abd: soft, nontender, no hepatomegaly  Ext: no edema Musculoskeletal:  No deformities, BUE and BLE strength normal and equal Skin: warm and dry  Neuro:  CNs 2-12 intact, no focal abnormalities noted Psych:  Normal affect   EKG:  The EKG was personally reviewed and demonstrates:  Sinus rhythm, nonspecific ST changes (biphasic T waves in leads I, aVL) Telemetry:  Telemetry was personally reviewed and demonstrates:  NA  Relevant CV Studies:   Laboratory Data:  High Sensitivity Troponin:   Recent Labs  Lab 06/22/22 1137 06/27/22 1229 06/27/22 1625 07/11/22 0559 07/11/22 0745  TROPONINIHS 2 3 <2 <2 4     Chemistry Recent Labs  Lab 07/11/22 0559  NA 135  K 3.4*  CL 104  CO2 23  GLUCOSE 104*  BUN 15  CREATININE 0.92  CALCIUM 9.3  GFRNONAA >60  ANIONGAP 8    No results for input(s): "PROT", "ALBUMIN", "AST", "ALT", "ALKPHOS", "BILITOT" in the last 168 hours. Lipids No results for input(s): "CHOL", "TRIG", "HDL", "LABVLDL", "LDLCALC", "CHOLHDL" in the last 168 hours.  Hematology Recent Labs  Lab 07/11/22 0559  WBC 8.2  RBC 4.71  HGB 14.2   HCT 41.8  MCV 88.7  MCH 30.1  MCHC 34.0  RDW 11.8  PLT 355   Thyroid No results for input(s): "TSH", "FREET4" in the last 168 hours.  BNPNo results for input(s): "BNP", "PROBNP" in the last 168 hours.  DDimer No results for input(s): "DDIMER" in the last 168 hours.   Radiology/Studies:  DG Chest 2 View  Result Date: 07/11/2022 CLINICAL DATA:  Chest pain EXAM: CHEST - 2 VIEW COMPARISON:  06/27/2022 FINDINGS: Low lung volumes. The cardio pericardial silhouette is enlarged. The lungs are clear without focal pneumonia, edema, pneumothorax or pleural effusion. Stable linear density right mid lung again noted, likely scar. The visualized bony structures of the thorax are unremarkable. IMPRESSION: Low volume film without acute cardiopulmonary findings. Electronically Signed   By: Kennith Center M.D.   On: 07/11/2022 06:20     Assessment and Plan:   Chest pain - Patient presents complaining of intermittent chest pain for the past several weeks, pain has been more constant the past few days. - Pain is located under left breast, feels like "tightness."  Pain radiated to jaw, arms, back. Associated with shortness of breath, nausea, palpitations.  Not associated with position, occurs both at rest and on exertion. - High sensitive troponins negative x2 in the ER - This is a 41 year old female with chest pain that has some typical, though mostly atypical features. Risk factors include history of HTN, HLD, and family history of CAD  - Was recently seen by a cardiologist in [place]-- wearing a heart monitor (zio patch) and planned to have an outpatient stress test in a few weeks. She has not had any echocardiograms or ischemic evaluations yet  - Patient is very anxious about her chest pain. Concerned that she could leave the hospital and have chest pain shortly after.  - Recommend echocardiogram and coronary CT-- orders placed, ordered a one time dose of metoprolol for HR control prior to  scan  Generalized Anxiety  -  Continue home medications   Hypothyroidism  - Continue home synthroid  - Check TSH in AM as patient complaining of palpitations   Seizure disorder  - Patient reports being recently taken off her depakote   HLD  - Patient reports history of HLD, but is not on cholesterol medicine  - Check lipid panel in AM   Risk Assessment/Risk Scores:   HEAR Score (for undifferentiated chest pain): Pathway blocked         For questions or updates, please contact [facility] Please consult [email or web address] for contact info under    Signed, Jonita Albee, PA-C  07/11/2022 11:29 AM  Agree with note by Robet Leu, PA-C.  Brianna [name] is seen in the ER for chest pain at the request of the ER physician.  She has positive risk factors including hypertension hyperlipidemia.  She has been to the ER several times in the last month for evaluation.  Her chest pain has been accelerating but is somewhat atypical.  Her EKG shows no acute changes.  Her enzymes are negative.  Her exam is benign.  I am going to get a coronary CTA and 2D echo to further evaluate.  Runell Gess, M.D., FACP, [facility], [facility] [address] [place], [place]  [address]  [phone] 07/11/2022 1:49 PM

## 2022-07-12 ENCOUNTER — Encounter (HOSPITAL_COMMUNITY): Payer: Self-pay | Admitting: Cardiovascular Disease

## 2022-07-12 DIAGNOSIS — E785 Hyperlipidemia, unspecified: Secondary | ICD-10-CM

## 2022-07-12 DIAGNOSIS — R0789 Other chest pain: Secondary | ICD-10-CM | POA: Diagnosis not present

## 2022-07-12 DIAGNOSIS — I10 Essential (primary) hypertension: Secondary | ICD-10-CM

## 2022-07-12 LAB — LIPID PANEL
Cholesterol: 182 mg/dL (ref 0–200)
HDL: 39 mg/dL — ABNORMAL LOW (ref >40)
LDL Cholesterol: 110 mg/dL — ABNORMAL HIGH (ref 0–99)
Total CHOL/HDL Ratio: 4.7 RATIO
Triglycerides: 163 mg/dL — ABNORMAL HIGH (ref <150)
VLDL: 33 mg/dL (ref 0–40)

## 2022-07-12 LAB — BASIC METABOLIC PANEL
Anion gap: 5 (ref 5–15)
BUN: 10 mg/dL (ref 6–20)
CO2: 26 mmol/L (ref 22–32)
Calcium: 8.8 mg/dL — ABNORMAL LOW (ref 8.9–10.3)
Chloride: 107 mmol/L (ref 98–111)
Creatinine, Ser: 0.94 mg/dL (ref 0.44–1.00)
GFR, Estimated: >60 mL/min (ref >60)
Glucose, Bld: 111 mg/dL — ABNORMAL HIGH (ref 70–99)
Potassium: 4.3 mmol/L (ref 3.5–5.1)
Sodium: 138 mmol/L (ref 135–145)

## 2022-07-12 LAB — TSH: TSH: 0.455 u[IU]/mL (ref 0.350–4.500)

## 2022-07-12 LAB — CBC
HCT: 42.3 % (ref 36.0–46.0)
Hemoglobin: 14 g/dL (ref 12.0–15.0)
MCH: 30.2 pg (ref 26.0–34.0)
MCHC: 33.1 g/dL (ref 30.0–36.0)
MCV: 91.2 fL (ref 80.0–100.0)
Platelets: 346 10*3/uL (ref 150–400)
RBC: 4.64 MIL/uL (ref 3.87–5.11)
RDW: 12 % (ref 11.5–15.5)
WBC: 9.4 10*3/uL (ref 4.0–10.5)
nRBC: 0 % (ref 0.0–0.2)

## 2022-07-12 MED ORDER — ROSUVASTATIN CALCIUM 10 MG PO TABS: 10.0000 mg | ORAL_TABLET | Freq: Every day | ORAL | 0 refills | Status: AC

## 2022-07-12 MED ORDER — ROSUVASTATIN CALCIUM 5 MG PO TABS: 10.0000 mg | ORAL_TABLET | Freq: Every day | ORAL | Status: DC

## 2022-07-12 NOTE — Plan of Care (Signed)

## 2022-07-12 NOTE — Telephone Encounter (Signed)
Left detailed message on voicemail. Instructed her to contact her PCP for referral of new problem and to request the office notes be faxed to Dr. Karie Georges attention.

## 2022-07-12 NOTE — Discharge Summary (Addendum)
Discharge Summary    Patient ID: Brianna [name] MRN: [id number]; DOB: 05/05/1981  Admit date: 07/11/2022 Discharge date: 07/12/2022  PCP:  System, Provider Not In   [facility] Providers Cardiologist:  Nanetta Batty, MD    (in [place]) Cardiologist: [facility]    Discharge Diagnoses    Principal Problem:   Chest pain Active Problems:   Hyperlipidemia   Hypertension  Diagnostic Studies/Procedures    Echo: 07/11/22  IMPRESSIONS     1. Left ventricular ejection fraction, by estimation, is 55 to 60%. The  left ventricle has normal function. The left ventricle has no regional  wall motion abnormalities. Left ventricular diastolic parameters were  normal.   2. Right ventricular systolic function is normal. The right ventricular  size is normal. Tricuspid regurgitation signal is inadequate for assessing  PA pressure.   3. The mitral valve is normal in structure. Mild mitral valve  regurgitation.   4. The aortic valve is tricuspid. Aortic valve regurgitation is not  visualized. No aortic stenosis is present.   FINDINGS   Left Ventricle: Left ventricular ejection fraction, by estimation, is 55  to 60%. The left ventricle has normal function. The left ventricle has no  regional wall motion abnormalities. The left ventricular internal cavity  size was normal in size. There is   no left ventricular hypertrophy. Left ventricular diastolic parameters  were normal. Normal left ventricular filling pressure.   Right Ventricle: The right ventricular size is normal. No increase in  right ventricular wall thickness. Right ventricular systolic function is  normal. Tricuspid regurgitation signal is inadequate for assessing PA  pressure.   Left Atrium: Left atrial size was normal in size.   Right Atrium: Right atrial size was normal in size.   Pericardium: There is no evidence of pericardial effusion.   Mitral Valve: The mitral valve is normal in structure. Mild mitral valve   regurgitation, with centrally-directed jet.   Tricuspid Valve: The tricuspid valve is normal in structure. Tricuspid  valve regurgitation is not demonstrated.   Aortic Valve: The aortic valve is tricuspid. Aortic valve regurgitation is  not visualized. No aortic stenosis is present.   Pulmonic Valve: The pulmonic valve was not well visualized. Pulmonic valve  regurgitation is not visualized.   Aorta: The aortic root and ascending aorta are structurally normal, with  no evidence of dilitation.   Venous: The inferior vena cava was not well visualized.   IAS/Shunts: No atrial level shunt detected by color flow Doppler.  _____________   History of Present Illness     Brianna [name] is a 41 y.o. female with a hx of a seizure disorder, migraine headaches, bipolar affective, GAD, hypothyroidism, asthma, chronic bronchitis who was seen 07/11/2022 for the evaluation of chest pain at the request of Dr. Criss Alvine.    Patient went to the ED three separate times on 7/20, 7/21, and 7/24 complaining of left sided chest pain. However, patient left all three times before being formally evaluated/treated due to long wait times. Initial troponin was negative at all three visits.    Patient was most recently seen by the [facility] ED no 06/27/22 for evaluation of chest pain. At that visit, patient had reported that she was told by her PCP on 7/21 that her troponins were positive and she was told to come to the ED. However, per chart review her troponins were negative on 7/21.  Troponins were also negative in the ED. It was noted that patient had an abnormal EKG, and  she was referred to cardiology.    Patient presented to the ED on 8/8 complaining of chest pain that had been ongoing since Sunday 8/6. Labs in the ED showed Na 135, K 3.5, creatinine 0.92, WBC 8.2, hemoglobin 14.2, platelets 355. hsTn 2>>4. CXR showed no acute cardiopulmonary findings.    On interview, patient reported having intermittent chest pain  for the past several weeks.  This past week, the pain has become more constant.  Pain is located under her left breast, radiates to both of her arms, neck, back. Feels like palpitations and tightness.  Associated with shortness of breath, nausea.  Present both on exertion and when at rest.  Patient has not tried any alleviating medications.  Pain is not associated with position.  Patient reported that she saw a cardiologist in [place] last week, was told to wear a heart monitor which she is currently wearing.  Was also told she would need to have a stress test in a few weeks.   Patient was very anxious about this chest pain.   Patient does have a history of hypertension, was recently told she has hyperlipidemia.  She denied any tobacco use.  Denied any history of diabetes.  Reports that 3 of her grandparents had heart attacks.  Her parents do not have any cardiac history.  Denies any personal past cardiac history.       Hospital Course     Chest pain: Patient presented complaining of intermittent chest pain for the past several weeks, pain has been more constant the past few days. High sensitive troponins negative x2.  Echocardiogram showed LVEF of 55 to 60%, no regional wall motion abnormality, normal RV size and function, no significant valvular disease.  Coronary CTA with calcium score of 0, no evidence of CAD. -- Was recently seen by a cardiologist in [place]-- wearing a heart monitor (zio patch), recommended to follow-up   Generalized Anxiety: Continue home medications    Hypothyroidism: Continue home synthroid  --TSH 0.45     Seizure disorder: Patient reports being recently taken off her depakote    HLD: LDL 110, HDL 39 --Started Crestor 10 mg daily --Will need LFTs/FLP in 8 weeks  General: Well developed, well nourished, female appearing in no acute distress. Head: Normocephalic, atraumatic.  Neck: Supple without bruits, JVD. Lungs:  Resp regular and unlabored, CTA. Heart: RRR, S1,  S2, no S3, S4, or murmur; no rub. Abdomen: Soft, non-tender, non-distended with normoactive bowel sounds. No hepatomegaly. No rebound/guarding. No obvious abdominal masses. Extremities: No clubbing, cyanosis, edema. Distal pedal pulses are 2+ bilaterally. Neuro: Alert and oriented X 3. Moves all extremities spontaneously. Psych: Normal affect.  Patient was seen by Dr. Allyson Sabal and deemed stable for discharge home.  Follow-up arranged.  Medication sent to patient's pharmacy of choice. _____________  Discharge Vitals Blood pressure 97/64, pulse 60, temperature 98.8 F (37.1 C), temperature source Oral, resp. rate 17, height 4\' 11"  (1.499 m), weight 82.6 kg, SpO2 100 %.  Filed Weights   07/12/22 0114  Weight: 82.6 kg    Labs & Radiologic Studies    CBC Recent Labs    07/11/22 2008 07/12/22 0326  WBC 8.7 9.4  HGB 13.6 14.0  HCT 41.3 42.3  MCV 90.0 91.2  PLT 379 346   Basic Metabolic Panel Recent Labs    09/11/22 0559 07/11/22 2008 07/12/22 0326  NA 135  --  138  K 3.4*  --  4.3  CL 104  --  107  CO2  23  --  26  GLUCOSE 104*  --  111*  BUN 15  --  10  CREATININE 0.92 0.82 0.94  CALCIUM 9.3  --  8.8*   Liver Function Tests No results for input(s): "AST", "ALT", "ALKPHOS", "BILITOT", "PROT", "ALBUMIN" in the last 72 hours. No results for input(s): "LIPASE", "AMYLASE" in the last 72 hours. High Sensitivity Troponin:   Recent Labs  Lab 06/22/22 1137 06/27/22 1229 06/27/22 1625 07/11/22 0559 07/11/22 0745  TROPONINIHS 2 3 <2 <2 4    BNP Invalid input(s): "POCBNP" D-Dimer No results for input(s): "DDIMER" in the last 72 hours. Hemoglobin A1C No results for input(s): "HGBA1C" in the last 72 hours. Fasting Lipid Panel Recent Labs    07/12/22 0326  CHOL 182  HDL 39*  LDLCALC 110*  TRIG 163*  CHOLHDL 4.7   Thyroid Function Tests Recent Labs    07/12/22 0326  TSH 0.455   _____________  ECHOCARDIOGRAM COMPLETE  Result Date: 07/11/2022    ECHOCARDIOGRAM  REPORT   Patient Name:   LOVEY CRUPI Hesse Date of Exam: 07/11/2022 Medical Rec #:  [id number]      Height:       60.0 in Accession #:    [id number]     Weight:       184.3 lb Date of Birth:  [birth year]     BSA:          1.803 m Patient Age:    40 years       BP:           104/75 mmHg Patient Gender: F              HR:           53 bpm. Exam Location:  Inpatient Procedure: 2D Echo Indications:    chest pain  History:        Patient has no prior history of Echocardiogram examinations.                 Signs/Symptoms:Chest Pain.  Sonographer:    Delcie Roch RDCS Referring Phys: [id number] Jonita Albee  Sonographer Comments: Suboptimal subcostal window. IMPRESSIONS  1. Left ventricular ejection fraction, by estimation, is 55 to 60%. The left ventricle has normal function. The left ventricle has no regional wall motion abnormalities. Left ventricular diastolic parameters were normal.  2. Right ventricular systolic function is normal. The right ventricular size is normal. Tricuspid regurgitation signal is inadequate for assessing PA pressure.  3. The mitral valve is normal in structure. Mild mitral valve regurgitation.  4. The aortic valve is tricuspid. Aortic valve regurgitation is not visualized. No aortic stenosis is present. FINDINGS  Left Ventricle: Left ventricular ejection fraction, by estimation, is 55 to 60%. The left ventricle has normal function. The left ventricle has no regional wall motion abnormalities. The left ventricular internal cavity size was normal in size. There is  no left ventricular hypertrophy. Left ventricular diastolic parameters were normal. Normal left ventricular filling pressure. Right Ventricle: The right ventricular size is normal. No increase in right ventricular wall thickness. Right ventricular systolic function is normal. Tricuspid regurgitation signal is inadequate for assessing PA pressure. Left Atrium: Left atrial size was normal in size. Right Atrium: Right atrial size was  normal in size. Pericardium: There is no evidence of pericardial effusion. Mitral Valve: The mitral valve is normal in structure. Mild mitral valve regurgitation, with centrally-directed jet. Tricuspid Valve: The tricuspid valve is normal in structure. Tricuspid valve regurgitation is  not demonstrated. Aortic Valve: The aortic valve is tricuspid. Aortic valve regurgitation is not visualized. No aortic stenosis is present. Pulmonic Valve: The pulmonic valve was not well visualized. Pulmonic valve regurgitation is not visualized. Aorta: The aortic root and ascending aorta are structurally normal, with no evidence of dilitation. Venous: The inferior vena cava was not well visualized. IAS/Shunts: No atrial level shunt detected by color flow Doppler.  LEFT VENTRICLE PLAX 2D LVIDd:         4.10 cm   Diastology LVIDs:         2.60 cm   LV e' medial:    8.81 cm/s LV PW:         0.80 cm   LV E/e' medial:  9.8 LV IVS:        0.70 cm   LV e' lateral:   10.30 cm/s LVOT diam:     1.80 cm   LV E/e' lateral: 8.4 LV SV:         48 LV SV Index:   27 LVOT Area:     2.54 cm  RIGHT VENTRICLE RV S prime:     8.92 cm/s TAPSE (M-mode): 1.9 cm LEFT ATRIUM           Index        RIGHT ATRIUM          Index LA diam:      3.20 cm 1.77 cm/m   RA Area:     8.93 cm LA Vol (A4C): 35.7 ml 19.80 ml/m  RA Volume:   17.90 ml 9.93 ml/m  AORTIC VALVE LVOT Vmax:   80.80 cm/s LVOT Vmean:  50.500 cm/s LVOT VTI:    0.188 m  AORTA Ao Root diam: 2.60 cm Ao Asc diam:  2.40 cm MITRAL VALVE MV Area (PHT): 4.10 cm    SHUNTS MV Decel Time: 185 msec    Systemic VTI:  0.19 m MV E velocity: 86.20 cm/s  Systemic Diam: 1.80 cm MV A velocity: 56.80 cm/s MV E/A ratio:  1.52 Mihai Croitoru MD Electronically signed by Thurmon FairMihai Croitoru MD Signature Date/Time: 07/11/2022/5:20:41 PM    Final    CT CORONARY MORPH W/CTA COR W/SCORE Vallarie MareW/CA W/CM &/OR WO/CM  Addendum Date: 07/11/2022   ADDENDUM REPORT: 07/11/2022 16:49 EXAM: OVER-READ INTERPRETATION  CT CHEST The following  report is an over-read performed by radiologist Dr. Vladimir CroftsP. Galleraniof [facility] on 07/11/2022. This over-read does not include interpretation of cardiac or coronary anatomy or pathology. The coronary CTA interpretation by the cardiologist is attached. COMPARISON:  None. FINDINGS: The thoracic aorta is normal in caliber.  No dissection. No mediastinal or hilar mass or lymphadenopathy. The esophagus is unremarkable. No acute pulmonary findings. No worrisome pulmonary lesions or pulmonary nodules. No pleural effusions. No significant upper abdominal findings. No significant bony findings. IMPRESSION: No significant extracardiac findings. Electronically Signed   By: Rudie MeyerP.  Gallerani M.D.   On: 07/11/2022 16:49   Result Date: 07/11/2022 CLINICAL DATA:  76F with chest pain EXAM: Cardiac/Coronary CTA TECHNIQUE: The patient was scanned on a [employer] scanner. FINDINGS: A 100 kV prospective scan was triggered in the descending thoracic aorta at 111 HU's. Axial non-contrast 3 mm slices were carried out through the heart. The data set was analyzed on a dedicated work station and scored using the Agatson method. Gantry rotation speed was 250 msecs and collimation was .6 mm. 0.8 mg of sl NTG was given. The 3D data set was reconstructed in 5% intervals of the  35-75% of the R-R cycle. Phases were analyzed on a dedicated work station using MPR, MIP and VRT modes. The patient received 100 cc of contrast. Coronary Arteries:  Normal coronary origin.  Right dominance. RCA is a large dominant artery that gives rise to PDA and PLA. There is no plaque. Left main is a large artery that gives rise to LAD and LCX arteries. LAD is a large vessel that has no plaque. LCX is a non-dominant artery that gives rise to one large OM1 branch. There is no plaque. Other findings: Left Ventricle: Normal size Left Atrium: Normal size Pulmonary Veins: Normal configuration Right Ventricle: Normal size Right Atrium: Normal size Cardiac valves:  No calcifications Thoracic aorta: Normal size Pulmonary Arteries: Normal size Systemic Veins: Normal drainage Pericardium: Normal thickness IMPRESSION: 1. Coronary calcium score of 0. 2. Normal coronary origin with right dominance. 3. No evidence of CAD. CAD-RADS 0. No evidence of CAD (0%). Consider non-atherosclerotic causes of chest pain. Electronically Signed: By: Epifanio Lesches M.D. On: 07/11/2022 16:33   DG Chest 2 View  Result Date: 07/11/2022 CLINICAL DATA:  Chest pain EXAM: CHEST - 2 VIEW COMPARISON:  06/27/2022 FINDINGS: Low lung volumes. The cardio pericardial silhouette is enlarged. The lungs are clear without focal pneumonia, edema, pneumothorax or pleural effusion. Stable linear density right mid lung again noted, likely scar. The visualized bony structures of the thorax are unremarkable. IMPRESSION: Low volume film without acute cardiopulmonary findings. Electronically Signed   By: Kennith Center M.D.   On: 07/11/2022 06:20   DG Chest 2 View  Result Date: 06/27/2022 CLINICAL DATA:  41 year old with chest pain. EXAM: CHEST - 2 VIEW COMPARISON:  Chest radiograph 06/22/2022 and chest radiograph 10/10/2021 FINDINGS: Again noted is a bandlike density in the right upper lung which appears to be chronic. Otherwise, the lungs are clear. Heart and mediastinum are within normal limits. Trachea is midline. No pleural effusions. Degenerative endplate changes in the thoracolumbar spine region. IMPRESSION: 1. No acute cardiopulmonary disease. 2. Chronic bandlike density in the right lung could represent an area of scar. Electronically Signed   By: Richarda Overlie M.D.   On: 06/27/2022 12:57   DG Chest 2 View  Result Date: 06/22/2022 CLINICAL DATA:  Chest pain. EXAM: CHEST - 2 VIEW COMPARISON:  None Available. FINDINGS: Low lung volumes with lordotic positioning. The heart size and mediastinal contours are within normal limits. Linear opacity in the right mid upper lung appears similar over multiple  priors and may be vascular or represent atelectasis/scar. Otherwise, both lungs are clear. No visible pleural effusions or pneumothorax. No acute osseous abnormality. IMPRESSION: No active cardiopulmonary disease. Electronically Signed   By: Feliberto Harts M.D.   On: 06/22/2022 12:03    Disposition   Pt is being discharged home today in good condition.  Follow-up Plans & Appointments     Follow-up Information     Cardiology Follow up.   Why: Please follow up with your cardiologist in [facility]               Discharge Instructions     Call MD for:  difficulty breathing, headache or visual disturbances   Complete by: As directed    Call MD for:  persistant dizziness or light-headedness   Complete by: As directed    Call MD for:  redness, tenderness, or signs of infection (pain, swelling, redness, odor or green/yellow discharge around incision site)   Complete by: As directed    Diet - low sodium heart  healthy   Complete by: As directed    Increase activity slowly   Complete by: As directed        Discharge Medications   Allergies as of 07/12/2022       Reactions   Hydrocodone Nausea And Vomiting, Other (See Comments)   Peanut Oil Itching   Hydrocodone Other (See Comments)   Projectile vomiting   Neosporin [bacitracin-polymyxin B] Swelling        Medication List     STOP taking these medications    meloxicam 15 MG tablet Commonly known as: MOBIC       TAKE these medications    Aimovig 140 MG/ML Soaj Generic drug: Erenumab-aooe INJECT 140 MG INTO THE SKIN EVERY 30 DAYS What changed: See the new instructions.   doxycycline 100 MG tablet Commonly known as: ADOXA He is 1 twice a day for the first week then start taking 1/day until seen by your dermatologist. What changed:  how much to take how to take this when to take this additional instructions   escitalopram 10 MG tablet Commonly known as: LEXAPRO Take 10 mg by mouth daily.   ferrous  sulfate 325 (65 FE) MG tablet Take 325 mg by mouth daily with breakfast.   gabapentin 100 MG capsule Commonly known as: NEURONTIN Take 100 mg by mouth 3 (three) times daily.   Humira Pen-CD/UC/HS Starter 80 MG/0.8ML Pnkt Generic drug: Adalimumab Inject 0.8 mLs into the skin every 14 (fourteen) days.   levothyroxine 25 MCG tablet Commonly known as: SYNTHROID Take 25 mcg by mouth at bedtime.   losartan 50 MG tablet Commonly known as: COZAAR Take 50 mg by mouth daily.   pantoprazole 40 MG tablet Commonly known as: PROTONIX Take 40 mg by mouth daily.   rosuvastatin 10 MG tablet Commonly known as: CRESTOR Take 1 tablet (10 mg total) by mouth daily.   Ubrelvy 100 MG Tabs Generic drug: Ubrogepant Take 100 mg by mouth daily as needed (migraine).           Outstanding Labs/Studies   FLP/LFTs in 8 weeks   Duration of Discharge Encounter   Greater than 30 minutes including physician time.  Signed, Laverda Page, NP 07/12/2022, 10:44 AM   Agree with note by Laverda Page NP-C  Patient admitted with chest pain.  She really has no cardiac risk factors.  Enzymes were low.  Her coronary CTA was essentially normal as was a 2D echo.  There does not seem to be a cardiac etiology for her chest pain.  She stable for discharge.  Will arrange follow-up.  Runell Gess, M.D., FACP, [facility], [facility] [address] [place], [place]  [address]  [phone] 07/12/2022 10:49 AM

## 2022-07-12 NOTE — Telephone Encounter (Signed)
Pt has called tom report she is on her way home.  Pt states all test came back negative, pt wants to know how does Dr Teresa Coombs  want to proceed, please call

## 2022-07-12 NOTE — Plan of Care (Signed)
  Problem: Education: Goal: Understanding of cardiac disease, CV risk reduction, and recovery process will improve Outcome: Adequate for Discharge Goal: Individualized Educational Video(s) Outcome: Adequate for Discharge   Problem: Activity: Goal: Ability to tolerate increased activity will improve Outcome: Adequate for Discharge   Problem: Cardiac: Goal: Ability to achieve and maintain adequate cardiovascular perfusion will improve Outcome: Adequate for Discharge   Problem: Health Behavior/Discharge Planning: Goal: Ability to safely manage health-related needs after discharge will improve Outcome: Adequate for Discharge   Problem: Education: Goal: Knowledge of General Education information will improve Description: Including pain rating scale, medication(s)/side effects and non-pharmacologic comfort measures Outcome: Adequate for Discharge   Problem: Health Behavior/Discharge Planning: Goal: Ability to manage health-related needs will improve Outcome: Adequate for Discharge   Problem: Clinical Measurements: Goal: Ability to maintain clinical measurements within normal limits will improve Outcome: Adequate for Discharge Goal: Will remain free from infection Outcome: Adequate for Discharge Goal: Diagnostic test results will improve Outcome: Adequate for Discharge Goal: Respiratory complications will improve Outcome: Adequate for Discharge Goal: Cardiovascular complication will be avoided Outcome: Adequate for Discharge   Problem: Activity: Goal: Risk for activity intolerance will decrease Outcome: Adequate for Discharge   Problem: Nutrition: Goal: Adequate nutrition will be maintained Outcome: Adequate for Discharge   Problem: Coping: Goal: Level of anxiety will decrease Outcome: Adequate for Discharge   Problem: Elimination: Goal: Will not experience complications related to bowel motility Outcome: Adequate for Discharge Goal: Will not experience complications  related to urinary retention Outcome: Adequate for Discharge   Problem: Pain Managment: Goal: General experience of comfort will improve Outcome: Adequate for Discharge   Problem: Safety: Goal: Ability to remain free from injury will improve Outcome: Adequate for Discharge   Problem: Skin Integrity: Goal: Risk for impaired skin integrity will decrease Outcome: Adequate for Discharge   

## 2022-07-12 NOTE — Telephone Encounter (Addendum)
I spoke to the patient. Reports she was being seen by her PCP recently. She was there for chest pain (multiple ED visits over the last month). Normal cardiology work-ups. While there, states her provider noticed right-sided mouth drooping and bilateral eye drooping (left side worse). Also, feels like her speech has been slurred. The symptoms have been present for several weeks. I explained what occurs during a typical TIA event. Although this does not fit the normal criteria, she is insistent to schedule a neurological consultation. Per vo by Dr. Teresa Coombs, likely not a TIA but willing to evaluate her in the office.

## 2022-07-12 NOTE — Progress Notes (Signed)
Pt c/o CP see MAR. Nitro SL 1 time dose pt denies CP at this time.

## 2022-07-13 LAB — LIPOPROTEIN A (LPA): Lipoprotein (a): 51.2 nmol/L — ABNORMAL HIGH (ref <75.0)

## 2022-07-18 ENCOUNTER — Emergency Department: Payer: 59

## 2022-07-18 ENCOUNTER — Other Ambulatory Visit: Payer: Self-pay

## 2022-07-18 ENCOUNTER — Encounter: Payer: Self-pay | Admitting: Emergency Medicine

## 2022-07-18 ENCOUNTER — Emergency Department
Admission: EM | Admit: 2022-07-18 | Discharge: 2022-07-18 | Disposition: A | Discharge status: Home / Self Care | Payer: 59 | Attending: Emergency Medicine | Admitting: Emergency Medicine

## 2022-07-18 DIAGNOSIS — R0789 Other chest pain: Secondary | ICD-10-CM | POA: Insufficient documentation

## 2022-07-18 DIAGNOSIS — I10 Essential (primary) hypertension: Secondary | ICD-10-CM | POA: Diagnosis not present

## 2022-07-18 LAB — BASIC METABOLIC PANEL
Anion gap: 6 (ref 5–15)
BUN: 10 mg/dL (ref 6–20)
CO2: 23 mmol/L (ref 22–32)
Calcium: 8.8 mg/dL — ABNORMAL LOW (ref 8.9–10.3)
Chloride: 109 mmol/L (ref 98–111)
Creatinine, Ser: 0.98 mg/dL (ref 0.44–1.00)
GFR, Estimated: >60 mL/min (ref >60)
Glucose, Bld: 105 mg/dL — ABNORMAL HIGH (ref 70–99)
Potassium: 3.7 mmol/L (ref 3.5–5.1)
Sodium: 138 mmol/L (ref 135–145)

## 2022-07-18 LAB — CBC
HCT: 40.7 % (ref 36.0–46.0)
Hemoglobin: 13.4 g/dL (ref 12.0–15.0)
MCH: 29.1 pg (ref 26.0–34.0)
MCHC: 32.9 g/dL (ref 30.0–36.0)
MCV: 88.5 fL (ref 80.0–100.0)
Platelets: 372 10*3/uL (ref 150–400)
RBC: 4.6 MIL/uL (ref 3.87–5.11)
RDW: 12 % (ref 11.5–15.5)
WBC: 6.5 10*3/uL (ref 4.0–10.5)
nRBC: 0 % (ref 0.0–0.2)

## 2022-07-18 LAB — TROPONIN I (HIGH SENSITIVITY): Troponin I (High Sensitivity): <2 ng/L (ref <18)

## 2022-07-18 NOTE — ED Provider Notes (Signed)
[facility] Provider Note    Event Date/Time   First MD Initiated Contact with Patient 07/18/22 1341     (approximate)   History   Chief Complaint: Chest Pain   HPI  Brianna [name] is a 41 y.o. female with a history of bipolar disorder, anxiety disorder, hypertension who comes ED complaining of chest pain.  This has been going on for several months intermittently.  Reviewing outside records, she has had extensive cardiology work-up including a gated CTA coronary angiogram which was normal, calcium score of 0 and no CAD identified.  Pain is in under the left breast.  Intermittent, lasting a few seconds at a time, worse with movement.  Not pleuritic, not exertional.  No shortness of breath diaphoresis or vomiting.     Physical Exam   Triage Vital Signs: ED Triage Vitals [07/18/22 1055]  Enc Vitals Group     BP 119/88     Pulse Rate 86     Resp 18     Temp 97.8 F (36.6 C)     Temp Source Oral     SpO2 97 %     Weight 182 lb (82.6 kg)     Height 4\' 11"  (1.499 m)     Head Circumference      Peak Flow      Pain Score 6     Pain Loc      Pain Edu?      Excl. in GC?     Most recent vital signs: Vitals:   07/18/22 1055 07/18/22 1330  BP: 119/88 114/86  Pulse: 86 68  Resp: 18   Temp: 97.8 F (36.6 C)   SpO2: 97% 98%    General: Awake, no distress.  CV:  Good peripheral perfusion.  Regular rate and rhythm Resp:  Normal effort.  Auscultation bilaterally Abd:  No distention.  Other:  No lower extremity edema.  Moist oral mucosa.  Pain reproducible on palpation of the left chest wall according to patient   ED Results / Procedures / Treatments   Labs (all labs ordered are listed, but only abnormal results are displayed) Labs Reviewed  BASIC METABOLIC PANEL - Abnormal; Notable for the following components:      Result Value   Glucose, Bld 105 (*)    Calcium 8.8 (*)    All other components within normal limits  CBC  POC URINE PREG,  ED  TROPONIN I (HIGH SENSITIVITY)  TROPONIN I (HIGH SENSITIVITY)     EKG Interpreted by me Normal sinus rhythm rate of 83.  Normal axis and intervals.  Normal QRS and ST segments.  Slight T wave inversions in anteroseptal leads, no significant change compared to previous EKG on July 11, 2022.   RADIOLOGY Chest x-ray interpreted by me, appears normal.  Radiology report reviewed.   PROCEDURES:  Procedures   MEDICATIONS ORDERED IN ED: Medications - No data to display   IMPRESSION / MDM / ASSESSMENT AND PLAN / ED COURSE  I reviewed the triage vital signs and the nursing notes.                              Differential diagnosis includes, but is not limited to, intercostal strain, chronic musculoskeletal pain, GERD, pneumothorax.  Patient's presentation is most consistent with exacerbation of chronic illness.  Patient presents with atypical chest pain, with background of normal cardiac work-up in the past.  Here EKG,  chest x-ray, and troponin are all normal.  I doubt ACS PE dissection AAA pericardial effusion.  No further work-up indicated, can be discharged to follow-up outpatient, supportive care at home.       FINAL CLINICAL IMPRESSION(S) / ED DIAGNOSES   Final diagnoses:  Atypical chest pain  Chest wall pain     Rx / DC Orders   ED Discharge Orders     None        Note:  This document was prepared using Dragon voice recognition software and may include unintentional dictation errors.   Sharman Cheek, MD 07/18/22 1504

## 2022-07-18 NOTE — ED Notes (Signed)
Pt speaking with MD about discharge. MD spoke with pt, DC papers in hand when she left.

## 2022-07-18 NOTE — Discharge Instructions (Signed)
Your EKG, chest x-ray, and lab tests are all normal today.  Please continue to follow-up with your doctor for monitoring of your symptoms.

## 2022-07-18 NOTE — ED Triage Notes (Signed)
Patient to ED for Chest Pain. Patient states pain started late last PM under left breast. Patient also c/o head, neck, and bilateral arms. Patient on phone during triage. Nad noted. Patient seen at [facility] and here for same recently.

## 2022-07-27 ENCOUNTER — Telehealth: Payer: Self-pay | Admitting: Neurology

## 2022-07-27 NOTE — Telephone Encounter (Signed)
FYI-Pt called to inform provider that she was in the ER and that they did an MRI due to stroke like symptoms at the [facility] on [place].

## 2022-07-28 NOTE — Telephone Encounter (Signed)
At 4:04 on 07-27-22 pt left vm asking if her MRI was received electronically, please call.

## 2022-07-31 ENCOUNTER — Encounter: Payer: Self-pay | Admitting: Neurology

## 2022-07-31 NOTE — Telephone Encounter (Signed)
I could not find any MRI records? Could you confirm records? Thanks!

## 2022-07-31 NOTE — Telephone Encounter (Signed)
MRI report received, Placed on MD's desk for review.

## 2022-07-31 NOTE — Progress Notes (Signed)
Patient had recent MRI Brain done 07/27/2022 No acute intracranial findings  Unchanged 3 mm T2 sequence signal hyperintensity within the right caudate nucleus head, possibly a small lacunar type infarction.

## 2022-08-05 ENCOUNTER — Emergency Department (HOSPITAL_COMMUNITY): Payer: Medicare Other

## 2022-08-05 ENCOUNTER — Emergency Department (HOSPITAL_COMMUNITY)
Admission: EM | Admit: 2022-08-05 | Discharge: 2022-08-05 | Disposition: A | Discharge status: Home / Self Care | Payer: Medicare Other | Attending: Emergency Medicine | Admitting: Emergency Medicine

## 2022-08-05 ENCOUNTER — Encounter (HOSPITAL_COMMUNITY): Payer: Self-pay | Admitting: Emergency Medicine

## 2022-08-05 ENCOUNTER — Other Ambulatory Visit: Payer: Self-pay

## 2022-08-05 DIAGNOSIS — E039 Hypothyroidism, unspecified: Secondary | ICD-10-CM | POA: Diagnosis not present

## 2022-08-05 DIAGNOSIS — I10 Essential (primary) hypertension: Secondary | ICD-10-CM | POA: Diagnosis not present

## 2022-08-05 DIAGNOSIS — S90862A Insect bite (nonvenomous), left foot, initial encounter: Secondary | ICD-10-CM | POA: Diagnosis not present

## 2022-08-05 DIAGNOSIS — G40909 Epilepsy, unspecified, not intractable, without status epilepticus: Secondary | ICD-10-CM

## 2022-08-05 DIAGNOSIS — W57XXXA Bitten or stung by nonvenomous insect and other nonvenomous arthropods, initial encounter: Secondary | ICD-10-CM | POA: Insufficient documentation

## 2022-08-05 DIAGNOSIS — Z91010 Allergy to peanuts: Secondary | ICD-10-CM | POA: Insufficient documentation

## 2022-08-05 DIAGNOSIS — R6883 Chills (without fever): Secondary | ICD-10-CM | POA: Diagnosis not present

## 2022-08-05 DIAGNOSIS — Z79899 Other long term (current) drug therapy: Secondary | ICD-10-CM | POA: Insufficient documentation

## 2022-08-05 DIAGNOSIS — Z794 Long term (current) use of insulin: Secondary | ICD-10-CM | POA: Insufficient documentation

## 2022-08-05 DIAGNOSIS — R0789 Other chest pain: Secondary | ICD-10-CM | POA: Diagnosis not present

## 2022-08-05 DIAGNOSIS — L03116 Cellulitis of left lower limb: Secondary | ICD-10-CM | POA: Insufficient documentation

## 2022-08-05 DIAGNOSIS — G459 Transient cerebral ischemic attack, unspecified: Secondary | ICD-10-CM | POA: Diagnosis not present

## 2022-08-05 DIAGNOSIS — R202 Paresthesia of skin: Secondary | ICD-10-CM | POA: Insufficient documentation

## 2022-08-05 DIAGNOSIS — S99922A Unspecified injury of left foot, initial encounter: Secondary | ICD-10-CM | POA: Diagnosis present

## 2022-08-05 LAB — COMPREHENSIVE METABOLIC PANEL
ALT: 26 U/L (ref 0–44)
AST: 16 U/L (ref 15–41)
Albumin: 4 g/dL (ref 3.5–5.0)
Alkaline Phosphatase: 74 U/L (ref 38–126)
Anion gap: 8 (ref 5–15)
BUN: 6 mg/dL (ref 6–20)
CO2: 26 mmol/L (ref 22–32)
Calcium: 9.4 mg/dL (ref 8.9–10.3)
Chloride: 107 mmol/L (ref 98–111)
Creatinine, Ser: 0.86 mg/dL (ref 0.44–1.00)
GFR, Estimated: >60 mL/min (ref >60)
Glucose, Bld: 99 mg/dL (ref 70–99)
Potassium: 3.7 mmol/L (ref 3.5–5.1)
Sodium: 141 mmol/L (ref 135–145)
Total Bilirubin: 0.6 mg/dL (ref 0.3–1.2)
Total Protein: 6.6 g/dL (ref 6.5–8.1)

## 2022-08-05 LAB — TROPONIN I (HIGH SENSITIVITY)
Troponin I (High Sensitivity): 3 ng/L (ref <18)
Troponin I (High Sensitivity): 4 ng/L (ref <18)

## 2022-08-05 LAB — TSH: TSH: 1.124 u[IU]/mL (ref 0.350–4.500)

## 2022-08-05 LAB — CBC WITH DIFFERENTIAL/PLATELET
Abs Immature Granulocytes: 0.01 10*3/uL (ref 0.00–0.07)
Basophils Absolute: 0 10*3/uL (ref 0.0–0.1)
Basophils Relative: 0 %
Eosinophils Absolute: 0.4 10*3/uL (ref 0.0–0.5)
Eosinophils Relative: 6 %
HCT: 41.3 % (ref 36.0–46.0)
Hemoglobin: 13.5 g/dL (ref 12.0–15.0)
Immature Granulocytes: 0 %
Lymphocytes Relative: 32 %
Lymphs Abs: 2 10*3/uL (ref 0.7–4.0)
MCH: 29.2 pg (ref 26.0–34.0)
MCHC: 32.7 g/dL (ref 30.0–36.0)
MCV: 89.4 fL (ref 80.0–100.0)
Monocytes Absolute: 0.6 10*3/uL (ref 0.1–1.0)
Monocytes Relative: 10 %
Neutro Abs: 3.3 10*3/uL (ref 1.7–7.7)
Neutrophils Relative %: 52 %
Platelets: 338 10*3/uL (ref 150–400)
RBC: 4.62 MIL/uL (ref 3.87–5.11)
RDW: 12.4 % (ref 11.5–15.5)
WBC: 6.4 10*3/uL (ref 4.0–10.5)
nRBC: 0 % (ref 0.0–0.2)

## 2022-08-05 LAB — T4, FREE: Free T4: 0.75 ng/dL (ref 0.61–1.12)

## 2022-08-05 LAB — I-STAT BETA HCG BLOOD, ED (MC, WL, AP ONLY): I-stat hCG, quantitative: <5 m[IU]/mL (ref <5)

## 2022-08-05 MED ORDER — LIDOCAINE 5 % EX PTCH: 1.0000 | MEDICATED_PATCH | CUTANEOUS | 0 refills | Status: DC

## 2022-08-05 MED ORDER — NITROGLYCERIN 0.4 MG SL SUBL
0.4000 mg | SUBLINGUAL_TABLET | SUBLINGUAL | Status: DC | PRN
  Administered 2022-08-05: 0.4 mg via SUBLINGUAL
  Filled 2022-08-05: qty 1

## 2022-08-05 MED ORDER — LORAZEPAM 2 MG/ML IJ SOLN
0.5000 mg | INTRAMUSCULAR | Status: AC | PRN
  Administered 2022-08-05: 0.5 mg via INTRAVENOUS
  Filled 2022-08-05: qty 1

## 2022-08-05 MED ORDER — ASPIRIN 325 MG PO TABS
325.0000 mg | ORAL_TABLET | Freq: Every day | ORAL | Status: DC
  Administered 2022-08-05: 325 mg via ORAL
  Filled 2022-08-05: qty 1

## 2022-08-05 MED ORDER — CEFAZOLIN SODIUM-DEXTROSE 1-4 GM/50ML-% IV SOLN: 1.0000 g | Freq: Once | INTRAVENOUS | Status: DC

## 2022-08-05 MED ORDER — CEPHALEXIN 250 MG PO CAPS
500.0000 mg | ORAL_CAPSULE | Freq: Once | ORAL | Status: AC
  Administered 2022-08-05: 500 mg via ORAL
  Filled 2022-08-05: qty 2

## 2022-08-05 MED ORDER — CEPHALEXIN 500 MG PO CAPS: 500.0000 mg | ORAL_CAPSULE | Freq: Four times a day (QID) | ORAL | 0 refills | Status: AC

## 2022-08-05 MED ORDER — LIDOCAINE 5 % EX PTCH
1.0000 | MEDICATED_PATCH | CUTANEOUS | Status: DC
  Administered 2022-08-05: 1 via TRANSDERMAL
  Filled 2022-08-05: qty 1

## 2022-08-05 MED ORDER — GADOBUTROL 1 MMOL/ML IV SOLN
8.0000 mL | Freq: Once | INTRAVENOUS | Status: AC | PRN
Start: 2022-08-05 — End: 2022-08-05
  Administered 2022-08-05: 8 mL via INTRAVENOUS

## 2022-08-05 MED ORDER — KETOROLAC TROMETHAMINE 15 MG/ML IJ SOLN
15.0000 mg | Freq: Once | INTRAMUSCULAR | Status: AC
  Administered 2022-08-05: 15 mg via INTRAVENOUS
  Filled 2022-08-05: qty 1

## 2022-08-05 MED ORDER — LORAZEPAM 1 MG PO TABS: 1.0000 mg | ORAL_TABLET | ORAL | Status: DC | PRN

## 2022-08-05 NOTE — Discharge Instructions (Addendum)
Please follow-up with your primary care provider to discuss your symptoms of cellulitis of the foot for which we have prescribed an oral antibiotic.  Return for any worsening of your symptoms to include worsening redness, swelling, fever or chills, purulent drainage from the foot, rapidly spreading redness and pain as this could indicate a serious infection that failed outpatient management or was resistant to the antibiotics that you are prescribed.  Your cardiac work-up was reassuring today with normal cardiac enzymes.  Your neurologic work-up to include MRI of the brain was negative for acute stroke.  Your MR angiogram of the head and neck was motion degraded therefore narrowing of the arteries in your head and neck could not fully be evaluated.  You could benefit from further work-up outpatient with your neurologist.  Your laboratory work-up was normal.  For your chest wall pain, recommend a lidocaine patch for pain control, high-dose NSAIDs for the next 2 to 3 days to include 800 mg of ibuprofen every 8 hours.  Try to avoid long-term use of NSAIDs as this can cause damage to your kidneys and can cause stomach ulcers.  Your kidney function was normal today. A referral to cardiology has been placed for a second opinion if you would prefer to follow-up with a different cardiologist outpatient.  I discussed your care with inpatient neurology, cardiology and the inpatient hospitalist service.

## 2022-08-05 NOTE — ED Notes (Signed)
Pt back from MRI. This nurse walked into room and pt eating pretzels. This nurse and NT had previously told patient she could not have food multiple times d/t imaging. Pt states she ate because she was starving. MD notified

## 2022-08-05 NOTE — ED Provider Notes (Signed)
[facility] EMERGENCY DEPARTMENT Provider Note   CSN: [id number] Arrival date & time: 08/05/22  1108     History  Chief Complaint  Patient presents with   Chest Pain    Brianna [name] is a 41 y.o. female.   Chest Pain    41 year old female with medical history significant for HTN, seizure disorder, hypothyroidism, bipolar affective disorder who presents to the emergency department with multiple complaints.  The patient states that she follows outpatient with cardiology in [place].  She has chronic chest discomfort and has never undergone cardiac catheterization.  She states that she developed left-sided chest pain that started this morning, described as an elephant sitting on her chest with some radiation to her jaw and down her arm.  She endorses bilateral arm tingling/paresthesias.  She denies any shortness of breath.  She endorses chills, denies any fevers.  She states that additionally, she was bit by a fire ant a day or 2 ago and developed redness and warmth along the dorsum of her left foot.  Additionally, she thinks she woke up this morning with left sided facial droop that lasted for around 10 minutes and has since resolved. Went to bed at midnight and was normal at that time.  Denies any focal numbness or weakness.  She denies any dysarthria, dysphagia, aphasia.  She denies any sharp or ripping tearing sensation or radiation of her chest pain to her back.  Home Medications Prior to Admission medications   Medication Sig Start Date End Date Taking? Authorizing Provider  amitriptyline (ELAVIL) 25 MG tablet Take 25 mg by mouth daily. 07/07/22  Yes [provider]  cephALEXin (KEFLEX) 500 MG capsule Take 1 capsule (500 mg total) by mouth 4 (four) times daily. 08/05/22  Yes Ernie Avena, MD  Erenumab-aooe (AIMOVIG) 140 MG/ML SOAJ INJECT 140 MG INTO THE SKIN EVERY 30 DAYS Patient taking differently: Inject 140 mg into the skin every 30 (thirty) days. 06/05/22   Yes Camara, Amalia Hailey, MD  escitalopram (LEXAPRO) 20 MG tablet Take 20 mg by mouth daily.   Yes [provider]  ferrous sulfate 325 (65 FE) MG tablet Take 325 mg by mouth daily with breakfast.   Yes [provider]  gabapentin (NEURONTIN) 100 MG capsule Take 100 mg by mouth at bedtime.   Yes [provider]  HUMIRA PEN-CD/UC/HS STARTER 80 MG/0.8ML PNKT Inject 0.8 mLs into the skin every 14 (fourteen) days. 02/10/22  Yes [provider]  hydrOXYzine (ATARAX) 10 MG tablet Take 10 mg by mouth every 8 (eight) hours as needed for itching or anxiety. 07/18/22  Yes [provider]  ibuprofen (ADVIL) 800 MG tablet Take 800 mg by mouth at bedtime.   Yes [provider]  levothyroxine (SYNTHROID) 25 MCG tablet Take 25 mcg by mouth at bedtime.   Yes [provider]  lidocaine (LIDODERM) 5 % Place 1 patch onto the skin daily. Remove & Discard patch within 12 hours or as directed by MD 08/05/22  Yes Ernie Avena, MD  losartan (COZAAR) 50 MG tablet Take 50 mg by mouth daily. 06/20/22  Yes [provider]  ondansetron (ZOFRAN) 4 MG tablet Take 4 mg by mouth every 8 (eight) hours as needed for nausea or vomiting. 07/18/22  Yes [provider]  promethazine (PHENERGAN) 25 MG tablet Take 25 mg by mouth every 6 (six) hours as needed for nausea or vomiting. 07/29/22  Yes [provider]  rosuvastatin (CRESTOR) 10 MG tablet Take 1 tablet (  10 mg total) by mouth daily. 07/12/22  Yes Arty Baumgartner, NP  Ubrogepant (UBRELVY) 100 MG TABS Take 100 mg by mouth daily as needed (migraine).   Yes [provider]  valACYclovir (VALTREX) 1000 MG tablet Take 1,000 mg by mouth every 8 (eight) hours as needed (cold sores). 07/23/22  Yes [provider]  venlafaxine XR (EFFEXOR-XR) 150 MG 24 hr capsule Take 150 mg by mouth daily. 07/18/22  Yes [provider]  doxycycline (ADOXA) 100 MG tablet He is 1 twice a day for the first  week then start taking 1/day until seen by your dermatologist. Patient not taking: Reported on 08/05/2022 01/30/22   Faythe Ghee, PA-C      Allergies    Hydrocodone, Peanut oil, Hydrocodone, and Neosporin [bacitracin-polymyxin b]    Review of Systems   Review of Systems  Cardiovascular:  Positive for chest pain.  Skin:  Positive for color change.  Neurological:  Positive for facial asymmetry.  All other systems reviewed and are negative.   Physical Exam Updated Vital Signs BP 126/86 (BP Location: Right Arm)   Pulse 80   Temp 98.1 F (36.7 C) (Oral)   Resp 18   Ht  (1.499 m)   Wt 81.6 kg   SpO2 94%   BMI 36.36 kg/m  Physical Exam Vitals and nursing note reviewed.  Constitutional:      General: She is not in acute distress.    Appearance: She is well-developed.  HENT:     Head: Normocephalic and atraumatic.  Eyes:     Conjunctiva/sclera: Conjunctivae normal.  Cardiovascular:     Rate and Rhythm: Normal rate and regular rhythm.     Heart sounds: No murmur heard. Pulmonary:     Effort: Pulmonary effort is normal. No respiratory distress.     Breath sounds: Normal breath sounds.  Chest:     Chest wall: Tenderness present.     Comments: Reproducible chest wall tenderness to palpation Abdominal:     Palpations: Abdomen is soft.     Tenderness: There is no abdominal tenderness.  Musculoskeletal:        General: No swelling.     Cervical back: Neck supple.  Skin:    General: Skin is warm and dry.     Capillary Refill: Capillary refill takes less than 2 seconds.  Neurological:     Mental Status: She is alert and oriented to person, place, and time.     GCS: GCS eye subscore is 4. GCS verbal subscore is 5. GCS motor subscore is 6.     Cranial Nerves: Cranial nerves 2-12 are intact.     Sensory: Sensation is intact.     Motor: Motor function is intact.     Coordination: Coordination is intact.  Psychiatric:        Mood and Affect: Mood normal.     ED  Results / Procedures / Treatments   Labs (all labs ordered are listed, but only abnormal results are displayed) Labs Reviewed  RESP PANEL BY RT-PCR (FLU A&B, COVID) [id number]  COMPREHENSIVE METABOLIC PANEL  CBC WITH DIFFERENTIAL/PLATELET  TSH  T4, FREE  I-STAT BETA HCG BLOOD, ED ([facility], [facility], [facility] ONLY)  TROPONIN I (HIGH SENSITIVITY)  TROPONIN I (HIGH SENSITIVITY)    EKG EKG Interpretation  Date/Time:  Saturday August 05 2022 13:08:33 EDT Ventricular Rate:  75 PR Interval:  138 QRS Duration: 97 QT Interval:  361 QTC Calculation: 404 R Axis:   15 Text Interpretation:  Sinus rhythm Low voltage, precordial leads Nonspecific T abnrm, anterolateral leads Confirmed by Ernie Avena ([id number]) on 08/05/2022 1:28:37 PM  Radiology MR BRAIN WO CONTRAST  Result Date: 08/05/2022 CLINICAL DATA:  Transient ischemic attack (TIA). Bilateral arm numbness. EXAM: MRI HEAD WITHOUT CONTRAST MRA HEAD WITHOUT CONTRAST MRA OF THE NECK WITHOUT AND WITH CONTRAST TECHNIQUE: Multiplanar, multi-echo pulse sequences of the brain and surrounding structures were acquired without intravenous contrast. Angiographic images of the Circle of Willis were acquired using MRA technique without intravenous contrast. Angiographic images of the neck were acquired using MRA technique without and with intravenous contrast. Carotid stenosis measurements (when applicable) are obtained utilizing NASCET criteria, using the distal internal carotid diameter as the denominator. CONTRAST:  73mL GADAVIST GADOBUTROL 1 MMOL/ML IV SOLN COMPARISON:  Head CT 08/05/2022 FINDINGS: MR HEAD FINDINGS The study is mildly motion degraded. Brain: There is no evidence of an acute infarct, intracranial hemorrhage, mass, midline shift, or extra-axial fluid collection. A chronic lacunar infarct is present anteriorly in the right corona radiata. The ventricles and sulci are normal. A partially empty sella is noted. Vascular: Major intracranial vascular flow voids are  preserved. Skull and upper cervical spine: Unremarkable bone marrow signal. Sinuses/Orbits: Unremarkable orbits. Paranasal sinuses and mastoid air cells are clear. Other: None. MRA HEAD FINDINGS The study is moderately motion degraded. Anterior circulation: The internal carotid arteries are patent from skull base to carotid termini with motion artifact limiting assessment for stenosis, however no significant intracranial ICA stenosis is evident on the contrast-enhanced neck MRA. ACAs and MCAs are patent without evidence of a proximal branch occlusion. No sizable aneurysm is identified. Posterior circulation: The intracranial vertebral arteries and the basilar artery are patent with motion artifact limiting assessment for stenosis, however no high-grade stenosis is evident on the contrast-enhanced neck MRA. Patent left PICA and bilateral SCA origins are identified. There are large posterior communicating arteries with hypoplastic P1 segments bilaterally. Both PCAs are patent without evidence of a significant proximal stenosis. No sizable aneurysm is identified. Anatomic variants: Fetal origin of the PCAs. MRA NECK FINDINGS The study is moderately motion degraded. Aortic arch: Standard 3 vessel aortic arch with widely patent arch vessel origins. Right carotid system: Patent with assessment of the proximal common carotid artery and carotid bifurcation limited by motion artifact. No evidence of a significant stenosis or dissection elsewhere. Left carotid system: Patent without evidence of a significant stenosis or dissection. Vertebral arteries: The vertebral arteries are poorly evaluated on the contrast-enhanced MRA due to venous contamination. On the noncontrast time-of-flight MRA, the proximal V1 segments are poorly seen due to motion artifact, however the vertebral arteries are patent more distally with antegrade flow bilaterally. IMPRESSION: 1. No acute intracranial abnormality. 2. Chronic lacunar infarct in the  right corona radiata. 3. Motion degraded head and neck MRAs. No large vessel occlusion. Limited assessment for stenosis. Electronically Signed   By: Sebastian Ache M.D.   On: 08/05/2022 19:31   MR Angiogram Neck W or Wo Contrast  Result Date: 08/05/2022 CLINICAL DATA:  Transient ischemic attack (TIA). Bilateral arm numbness. EXAM: MRI HEAD WITHOUT CONTRAST MRA HEAD WITHOUT CONTRAST MRA OF THE NECK WITHOUT AND WITH CONTRAST TECHNIQUE: Multiplanar, multi-echo pulse sequences of the brain and surrounding structures were acquired without intravenous contrast. Angiographic images of the Circle of Willis were acquired using MRA technique without intravenous contrast. Angiographic images of the neck were acquired using MRA technique without and with intravenous contrast. Carotid stenosis measurements (when applicable) are obtained utilizing NASCET criteria, using  the distal internal carotid diameter as the denominator. CONTRAST:  8mL GADAVIST GADOBUTROL 1 MMOL/ML IV SOLN COMPARISON:  Head CT 08/05/2022 FINDINGS: MR HEAD FINDINGS The study is mildly motion degraded. Brain: There is no evidence of an acute infarct, intracranial hemorrhage, mass, midline shift, or extra-axial fluid collection. A chronic lacunar infarct is present anteriorly in the right corona radiata. The ventricles and sulci are normal. A partially empty sella is noted. Vascular: Major intracranial vascular flow voids are preserved. Skull and upper cervical spine: Unremarkable bone marrow signal. Sinuses/Orbits: Unremarkable orbits. Paranasal sinuses and mastoid air cells are clear. Other: None. MRA HEAD FINDINGS The study is moderately motion degraded. Anterior circulation: The internal carotid arteries are patent from skull base to carotid termini with motion artifact limiting assessment for stenosis, however no significant intracranial ICA stenosis is evident on the contrast-enhanced neck MRA. ACAs and MCAs are patent without evidence of a proximal  branch occlusion. No sizable aneurysm is identified. Posterior circulation: The intracranial vertebral arteries and the basilar artery are patent with motion artifact limiting assessment for stenosis, however no high-grade stenosis is evident on the contrast-enhanced neck MRA. Patent left PICA and bilateral SCA origins are identified. There are large posterior communicating arteries with hypoplastic P1 segments bilaterally. Both PCAs are patent without evidence of a significant proximal stenosis. No sizable aneurysm is identified. Anatomic variants: Fetal origin of the PCAs. MRA NECK FINDINGS The study is moderately motion degraded. Aortic arch: Standard 3 vessel aortic arch with widely patent arch vessel origins. Right carotid system: Patent with assessment of the proximal common carotid artery and carotid bifurcation limited by motion artifact. No evidence of a significant stenosis or dissection elsewhere. Left carotid system: Patent without evidence of a significant stenosis or dissection. Vertebral arteries: The vertebral arteries are poorly evaluated on the contrast-enhanced MRA due to venous contamination. On the noncontrast time-of-flight MRA, the proximal V1 segments are poorly seen due to motion artifact, however the vertebral arteries are patent more distally with antegrade flow bilaterally. IMPRESSION: 1. No acute intracranial abnormality. 2. 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 3. Motion degraded head and neck MRAs. No large vessel occlusion. Limited assessment for stenosis. Electronically Signed   By: Sebastian Ache M.D.   On: 08/05/2022 19:31   MR ANGIO HEAD WO CONTRAST  Result Date: 08/05/2022 CLINICAL DATA:  Transient ischemic attack (TIA). Bilateral arm numbness. EXAM: MRI HEAD WITHOUT CONTRAST MRA HEAD WITHOUT CONTRAST MRA OF THE NECK WITHOUT AND WITH CONTRAST TECHNIQUE: Multiplanar, multi-echo pulse sequences of the brain and surrounding structures were acquired without  intravenous contrast. Angiographic images of the Circle of Willis were acquired using MRA technique without intravenous contrast. Angiographic images of the neck were acquired using MRA technique without and with intravenous contrast. Carotid stenosis measurements (when applicable) are obtained utilizing NASCET criteria, using the distal internal carotid diameter as the denominator. CONTRAST:  8mL GADAVIST GADOBUTROL 1 MMOL/ML IV SOLN COMPARISON:  Head CT 08/05/2022 FINDINGS: MR HEAD FINDINGS The study is mildly motion degraded. Brain: There is no evidence of an acute infarct, intracranial hemorrhage, mass, midline shift, or extra-axial fluid collection. A chronic lacunar infarct is present anteriorly in the right corona radiata. The ventricles and sulci are normal. A partially empty sella is noted. Vascular: Major intracranial vascular flow voids are preserved. Skull and upper cervical spine: Unremarkable bone marrow signal. Sinuses/Orbits: Unremarkable orbits. Paranasal sinuses and mastoid air cells are clear. Other: None. MRA HEAD FINDINGS The study is moderately motion degraded. Anterior circulation: The  internal carotid arteries are patent from skull base to carotid termini with motion artifact limiting assessment for stenosis, however no significant intracranial ICA stenosis is evident on the contrast-enhanced neck MRA. ACAs and MCAs are patent without evidence of a proximal branch occlusion. No sizable aneurysm is identified. Posterior circulation: The intracranial vertebral arteries and the basilar artery are patent with motion artifact limiting assessment for stenosis, however no high-grade stenosis is evident on the contrast-enhanced neck MRA. Patent left PICA and bilateral SCA origins are identified. There are large posterior communicating arteries with hypoplastic P1 segments bilaterally. Both PCAs are patent without evidence of a significant proximal stenosis. No sizable aneurysm is identified. Anatomic  variants: Fetal origin of the PCAs. MRA NECK FINDINGS The study is moderately motion degraded. Aortic arch: Standard 3 vessel aortic arch with widely patent arch vessel origins. Right carotid system: Patent with assessment of the proximal common carotid artery and carotid bifurcation limited by motion artifact. No evidence of a significant stenosis or dissection elsewhere. Left carotid system: Patent without evidence of a significant stenosis or dissection. Vertebral arteries: The vertebral arteries are poorly evaluated on the contrast-enhanced MRA due to venous contamination. On the noncontrast time-of-flight MRA, the proximal V1 segments are poorly seen due to motion artifact, however the vertebral arteries are patent more distally with antegrade flow bilaterally. IMPRESSION: 1. No acute intracranial abnormality. 2. 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 3. Motion degraded head and neck MRAs. No large vessel occlusion. Limited assessment for stenosis. Electronically Signed   By: Sebastian Ache M.D.   On: 08/05/2022 19:31   CT HEAD WO CONTRAST ( )  Result Date: 08/05/2022 CLINICAL DATA:  TIA, bilateral arm numbness EXAM: CT HEAD WITHOUT CONTRAST TECHNIQUE: Contiguous axial images were obtained from the base of the skull through the vertex without intravenous contrast. RADIATION DOSE REDUCTION: This exam was performed according to the departmental dose-optimization program which includes automated exposure control, adjustment of the mA and/or kV according to patient size and/or use of iterative reconstruction technique. COMPARISON:  None Available. FINDINGS: Brain: No evidence of acute infarction, hemorrhage, hydrocephalus, extra-axial collection or mass lesion/mass effect. Vascular: No hyperdense vessel or unexpected calcification. Skull: Normal. Negative for fracture or focal lesion. Sinuses/Orbits: No acute finding. Other: None. IMPRESSION: No acute intracranial pathology. No noncontrast CT  findings to explain arm numbness. Consider MRI to more sensitively evaluate for acute diffusion restricting infarction if clinically suspected. Electronically Signed   By: Jearld Lesch M.D.   On: 08/05/2022 13:07   DG Chest Portable 1 View  Result Date: 08/05/2022 CLINICAL DATA:  Chest pain and shortness of breath. EXAM: PORTABLE CHEST 1 VIEW COMPARISON:  07/18/2022 FINDINGS: Heart size and mediastinal contours are unremarkable. There is no pleural effusion or edema identified. No airspace opacities identified. The visualized osseous structures are unremarkable. IMPRESSION: No active disease. Electronically Signed   By: Signa Kell M.D.   On: 08/05/2022 12:11    Procedures Procedures    Medications Ordered in ED Medications  lidocaine (LIDODERM) 5 % 1 patch (1 patch Transdermal Patch Applied 08/05/22 1309)  aspirin tablet 325 mg (325 mg Oral Given 08/05/22 1438)  nitroGLYCERIN (NITROSTAT) SL tablet 0.4 mg (0.4 mg Sublingual Given 08/05/22 1438)  LORazepam (ATIVAN) tablet 1 mg (has no administration in time range)  cephALEXin (KEFLEX) capsule 500 mg (500 mg Oral Given 08/05/22 1309)  LORazepam (ATIVAN) injection 0.5 mg (0.5 mg Intravenous Given 08/05/22 1701)  ketorolac (TORADOL) 15 MG/ML injection 15 mg (15 mg Intravenous Given  08/05/22 1701)  gadobutrol (GADAVIST) 1 MMOL/ML injection 8 mL (8 mLs Intravenous Contrast Given 08/05/22 1905)    ED Course/ Medical Decision Making/ A&P                           Medical Decision Making Amount and/or Complexity of Data Reviewed Labs: ordered. Radiology: ordered.  Risk OTC drugs. Prescription drug management.     41 year old female with medical history significant for HTN, seizure disorder, hypothyroidism, bipolar affective disorder who presents to the emergency department with multiple complaints.  The patient states that she [place] outpatient with cardiology in [place].  She has chronic chest discomfort and has never undergone cardiac  catheterization.  She states that she developed left-sided chest pain that started this morning, described as an elephant sitting on her chest with some radiation to her jaw and down her arm.  She endorses bilateral arm tingling/paresthesias.  She denies any shortness of breath.  She endorses chills, denies any fevers.  She states that additionally, she was bit by a fire ant a day or 2 ago and developed redness and warmth along the dorsum of her left foot.  Additionally, she thinks she woke up this morning with left sided facial droop that lasted for around 10 minutes and has since resolved. Went to bed at midnight and was normal at that time.  Denies any focal numbness or weakness.  She denies any dysarthria, dysphagia, aphasia.  She denies any sharp or ripping tearing sensation or radiation of her chest pain to her back.  On arrival, the patient was vitally stable, sinus rhythm noted on cardiac telemetry.  Physical exam significant for reproducible chest wall tenderness to palpation, neurologically intact.  Initial EKG revealed nonspecific T wave abnormalities in the anterolateral leads.  Chest x-ray was performed which revealed no focal airspace disease, reviewed and interpreted by myself in addition to radiology.  Regarding the patient's episode of facial weakness/droop, considered TIA.  Patient has recently undergone MRI imaging outpatient for similar presentation.  [facility] revealed a small lacunar infarct in the right caudate that is old.  On chart review, the patient had previously been admitted to the cardiology service from 8/88/9/23 during which time she underwent echocardiogram with an EF of 55 to 60%, no diastolic dysfunction, no pulmonary arterial hypertension.  Diagnoses at discharge were chest pain with a coronary CTA calcium score of 0 with no evidence of CAD.  She was also diagnosed with generalized anxiety and hypothyroidism with a TSH of 0.45.  She is  prescribed 25 mcg of Synthroid.  She is on Lexapro 10 mg for anxiety.  The patient did initially state that her chest pain improved with nitroglycerin administered by EMS, however, on repeat assessment, she stated that the nitroglycerin did not help.  Differential diagnosis of the patient's chest discomfort includes: Most likely MSK chest wall pain, anxiety, also considered ACS, pneumonia, pneumothorax, pulmonary embolism,pericarditis/myocarditis, GERD, PUD. HEART score of 2, low risk. Patient was given ASA 325 mg, was given nitroglycerin.  For her chest wall pain, she was offered a lidocaine patch and IV toradol.    PERC negative. Labs unremarkable.  Unlikely pneumonia, no cough, no leukocytosis, no fevers, CXR and exam without acute findings. Unlikely pneumothorax, no findings on  CXR. Unlikely pericarditis/myocarditis, does not fit clinical picture. Chest pain not exertional. Unlikely dissection, no pulse deficit, no tearing chest pain, no neurologic complaints. Due to patient's high risk will  rule out ACS.  Lab results include: See without a leukocytosis or anemia, TSH, free T4 normal, troponins x2 negative, hCG normal, CMP completely unremarkable.  The patient refused COVID-19 and influenza PCR testing   Imaging results include: Chest x-ray without acute cardiac or pulmonary abnormality.  CT imaging of the head without acute intracranial abnormality.  I did speak with Dr. Amada Jupiter of on-call neurology who recommended MRI imaging of the brain, MRA of the head and neck and if negative, follow-up outpatient with her neurologist at [facility] neurolog[facility].  MRI Brain:  IMPRESSION:  1. No acute intracranial abnormality.  2. 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  3. Motion degraded head and neck MRAs. No large vessel occlusion.  Limited assessment for stenosis.   MRA Head & Neck:  IMPRESSION:  1. No acute intracranial abnormality.  2. Chronic lacunar infarct in the right  corona radiata.  3. Motion degraded head and neck MRAs. No large vessel occlusion.  Limited assessment for stenosis.    Course of tx has consisted of: Lidocaine patch, aspirin, nitroglycerin. Nitroglycerin did NOT help the patient's chest discomfort and instead caused a headache.   Work with Dr. Eden Emms of on-call cardiology who felt like the patient's EKG showed improvement compared to prior EKGs, with a negative troponin, recent admission with negative work-up to include negative coronary CT with a negative coronary calcium score that the patient's chest discomfort is most likely noncardiac.  I agree with this assessment as the patient does have reproducible chest wall tenderness to palpation pointing more towards musculoskeletal chest pain.  On repeat assessment, the patient states that her chest wall pain has improved following Toradol administration.   I spoke with Dr. Julian Reil of hospitalist medicine as the patient was adamantly requesting inpatient hospitalization for observation. He did not see an indication for admission at this time. I had an extensive discussion with the patient bedside regarding her workup and presentation. Provided reassurance and return precautions. Advised outpatient ABX to treat for mild cellulitis of the left foot.    Patient's clinical presentation is most consistent with chest wall pain, cellulitis of the left foot. Stable for outpatient management at this time. Advised to follow-up outpatient with her PCP, Cardiology, and Neurology.  DC Instructions: Please follow-up with your primary care provider to discuss your symptoms of cellulitis of the foot for which we have prescribed an oral antibiotic.  Return for any worsening of your symptoms to include worsening redness, swelling, fever or chills, purulent drainage from the foot, rapidly spreading redness and pain as this could indicate a serious infection that failed outpatient management or was resistant to the  antibiotics that you are prescribed.  Your cardiac work-up was reassuring today with normal cardiac enzymes.  Your neurologic work-up to include MRI of the brain was negative for acute stroke.  Your MR angiogram of the head and neck was motion degraded therefore narrowing of the arteries in your head and neck could not fully be evaluated.  You could benefit from further work-up outpatient with your neurologist.  Your laboratory work-up was normal.  For your chest wall pain, recommend a lidocaine patch for pain control, high-dose NSAIDs for the next 2 to 3 days to include 800 mg of ibuprofen every 8 hours.  Try to avoid long-term use of NSAIDs as this can cause damage to your kidneys and can cause stomach ulcers.  Your kidney function was normal today. A referral to cardiology has been placed for a second opinion if  you would prefer to follow-up with a different cardiologist outpatient.  I discussed your care with inpatient neurology, cardiology and the inpatient hospitalist service.   Final Clinical Impression(s) / ED Diagnoses Final diagnoses:  Atypical chest pain  Chest wall pain  Cellulitis of left foot  Insect bite of left foot, initial encounter    Rx / DC Orders ED Discharge Orders          Ordered    Ambulatory referral to Cardiology       Comments: If you have not heard from the Cardiology office within the next 72 hours please call [phone].   08/05/22 1940    cephALEXin (KEFLEX) 500 MG capsule  4 times daily        08/05/22 2002    lidocaine (LIDODERM) 5 %  Every 24 hours        08/05/22 2005              Ernie Avena, MD 08/05/22 2031

## 2022-08-05 NOTE — ED Triage Notes (Signed)
Pt endorses left sided chest pain that started this morning. Endorses bilateral arm numbness. Pain to left side of jaw.

## 2022-08-07 ENCOUNTER — Emergency Department (HOSPITAL_COMMUNITY): Payer: Medicare Other

## 2022-08-07 ENCOUNTER — Ambulatory Visit
Admission: EM | Admit: 2022-08-07 | Discharge: 2022-08-07 | Disposition: A | Discharge status: Home / Self Care | Payer: Medicare Other

## 2022-08-07 ENCOUNTER — Emergency Department (HOSPITAL_COMMUNITY)
Admission: EM | Admit: 2022-08-07 | Discharge: 2022-08-07 | Disposition: A | Discharge status: Home / Self Care | Payer: Medicare Other | Attending: Emergency Medicine | Admitting: Emergency Medicine

## 2022-08-07 DIAGNOSIS — Z79899 Other long term (current) drug therapy: Secondary | ICD-10-CM | POA: Diagnosis not present

## 2022-08-07 DIAGNOSIS — S80861A Insect bite (nonvenomous), right lower leg, initial encounter: Secondary | ICD-10-CM | POA: Diagnosis not present

## 2022-08-07 DIAGNOSIS — M79604 Pain in right leg: Secondary | ICD-10-CM | POA: Diagnosis present

## 2022-08-07 DIAGNOSIS — Z91010 Allergy to peanuts: Secondary | ICD-10-CM | POA: Insufficient documentation

## 2022-08-07 DIAGNOSIS — L03115 Cellulitis of right lower limb: Secondary | ICD-10-CM | POA: Insufficient documentation

## 2022-08-07 DIAGNOSIS — W57XXXA Bitten or stung by nonvenomous insect and other nonvenomous arthropods, initial encounter: Secondary | ICD-10-CM

## 2022-08-07 DIAGNOSIS — Z794 Long term (current) use of insulin: Secondary | ICD-10-CM | POA: Insufficient documentation

## 2022-08-07 LAB — CBC WITH DIFFERENTIAL/PLATELET
Abs Immature Granulocytes: 0.02 10*3/uL (ref 0.00–0.07)
Basophils Absolute: 0 10*3/uL (ref 0.0–0.1)
Basophils Relative: 1 %
Eosinophils Absolute: 0.3 10*3/uL (ref 0.0–0.5)
Eosinophils Relative: 4 %
HCT: 41.3 % (ref 36.0–46.0)
Hemoglobin: 13.6 g/dL (ref 12.0–15.0)
Immature Granulocytes: 0 %
Lymphocytes Relative: 31 %
Lymphs Abs: 2.7 10*3/uL (ref 0.7–4.0)
MCH: 29.6 pg (ref 26.0–34.0)
MCHC: 32.9 g/dL (ref 30.0–36.0)
MCV: 89.8 fL (ref 80.0–100.0)
Monocytes Absolute: 0.8 10*3/uL (ref 0.1–1.0)
Monocytes Relative: 9 %
Neutro Abs: 4.8 10*3/uL (ref 1.7–7.7)
Neutrophils Relative %: 55 %
Platelets: 334 10*3/uL (ref 150–400)
RBC: 4.6 MIL/uL (ref 3.87–5.11)
RDW: 12.7 % (ref 11.5–15.5)
WBC: 8.7 10*3/uL (ref 4.0–10.5)
nRBC: 0 % (ref 0.0–0.2)

## 2022-08-07 LAB — BASIC METABOLIC PANEL
Anion gap: 4 — ABNORMAL LOW (ref 5–15)
BUN: 8 mg/dL (ref 6–20)
CO2: 28 mmol/L (ref 22–32)
Calcium: 9.3 mg/dL (ref 8.9–10.3)
Chloride: 109 mmol/L (ref 98–111)
Creatinine, Ser: 0.96 mg/dL (ref 0.44–1.00)
GFR, Estimated: >60 mL/min (ref >60)
Glucose, Bld: 101 mg/dL — ABNORMAL HIGH (ref 70–99)
Potassium: 3.9 mmol/L (ref 3.5–5.1)
Sodium: 141 mmol/L (ref 135–145)

## 2022-08-07 LAB — LACTIC ACID, PLASMA: Lactic Acid, Venous: 1.1 mmol/L (ref 0.5–1.9)

## 2022-08-07 MED ORDER — KETOROLAC TROMETHAMINE 15 MG/ML IJ SOLN
15.0000 mg | Freq: Once | INTRAMUSCULAR | Status: AC
  Administered 2022-08-07: 15 mg via INTRAVENOUS
  Filled 2022-08-07: qty 1

## 2022-08-07 MED ORDER — VANCOMYCIN HCL 1500 MG/300ML IV SOLN
1500.0000 mg | Freq: Once | INTRAVENOUS | Status: AC
  Administered 2022-08-07: 1500 mg via INTRAVENOUS
  Filled 2022-08-07: qty 300

## 2022-08-07 MED ORDER — DIPHENHYDRAMINE-ZINC ACETATE 2-0.1 % EX CREA: TOPICAL_CREAM | Freq: Three times a day (TID) | CUTANEOUS | Status: DC | PRN

## 2022-08-07 MED ORDER — IOHEXOL 300 MG/ML  SOLN
75.0000 mL | Freq: Once | INTRAMUSCULAR | Status: AC | PRN
  Administered 2022-08-07: 75 mL via INTRAVENOUS

## 2022-08-07 MED ORDER — LACTATED RINGERS IV BOLUS
1000.0000 mL | Freq: Once | INTRAVENOUS | Status: AC
  Administered 2022-08-07: 1000 mL via INTRAVENOUS

## 2022-08-07 MED ORDER — VANCOMYCIN HCL IN DEXTROSE 1-5 GM/200ML-% IV SOLN: 1000.0000 mg | INTRAVENOUS | Status: DC

## 2022-08-07 MED ORDER — FENTANYL CITRATE PF 50 MCG/ML IJ SOSY
50.0000 ug | PREFILLED_SYRINGE | Freq: Once | INTRAMUSCULAR | Status: AC
  Administered 2022-08-07: 50 ug via INTRAVENOUS
  Filled 2022-08-07: qty 1

## 2022-08-07 NOTE — Progress Notes (Signed)
Pharmacy Antibiotic Note  Brianna [name] is a 41 y.o. female admitted on 08/07/2022 with cellulitis.  Pharmacy has been consulted for vancomycin dosing.  Presents with insect bite, still red, painful and swollen - was recently treated with cephalexin when evaluated on 08/05/2022. WBC 8.7, afebrile. Scr 0.96 (CrCl 71 mL/min).   Plan: Vancomycin 1500 mg IV once then 1g IV every 24 hours (estAUC 510) Monitor renal fx, cx results, clinical pic, and levels when appropriate     Temp (24hrs), Avg:98.2 F (36.8 C), Min:97.9 F (36.6 C), Max:98.5 F (36.9 C)  Recent Labs  Lab 08/05/22 1122 08/07/22 1608  WBC 6.4 8.7  CREATININE 0.86 0.96    Estimated Creatinine Clearance: 71.3 mL/min (by C-G formula based on SCr of 0.96 mg/dL).    Allergies  Allergen Reactions   Hydrocodone Nausea And Vomiting and Other (See Comments)   Peanut Oil Itching   Hydrocodone Other (See Comments)    Projectile vomiting   Neosporin [Bacitracin-Polymyxin B] Swelling    Antimicrobials this admission: Vancomycin 9/4 >>   Dose adjustments this admission: N/A  Microbiology results: 9/4 BCx: sent  Thank you for allowing pharmacy to be a part of this patient's care.  Sherron Monday, PharmD, BCCCP Clinical Pharmacist  Phone: [phone] 08/07/2022 5:59 PM  Please check AMION for all [facility] phone numbers After 10:00 PM, call Main Pharmacy [phone]

## 2022-08-07 NOTE — ED Triage Notes (Signed)
Patient to Urgent Care with complaints of an insect bite one day ago present to her right knee. Redness, warmth and swelling present to the area, swelling extends into upper shin. Reports itching but no drainage. Denies any known fevers.

## 2022-08-07 NOTE — ED Provider Notes (Signed)
[facility] EMERGENCY DEPARTMENT Provider Note   CSN: [id number] Arrival date & time: 08/07/22  1506     History  No chief complaint on file.   Brianna [name] is a 41 y.o. female.  HPI 41 year old female presents with right leg infection.  She feels it came from a spider bite though she did not actually see a bite or feel anything.  She thinks this started yesterday.  She was started on Keflex for something else a couple days ago.  Went to urgent care today because it was getting worse and they told her to come to the ER for worse.  She denies fevers but is having severe pain that she rates 10 out of 10.  The calf is quite firm to her. She states the area is enlarging and getting more red since urgent care this morning. No trauma. Was told she would need IV antibiotics.  Of note, she tells me that she has been taking the Keflex once per day at night because she is worried about how will affect her during the day and often many meds make her sleepy.  Home Medications Prior to Admission medications   Medication Sig Start Date End Date Taking? Authorizing Provider  amitriptyline (ELAVIL) 25 MG tablet Take 25 mg by mouth daily. 07/07/22   [provider]  cephALEXin (KEFLEX) 500 MG capsule Take 1 capsule (500 mg total) by mouth 4 (four) times daily. 08/05/22   Ernie Avena, MD  doxycycline (ADOXA) 100 MG tablet He is 1 twice a day for the first week then start taking 1/day until seen by your dermatologist. Patient not taking: Reported on 08/05/2022 01/30/22   Sherrie Mustache, Roselyn Bering, PA-C  Erenumab-aooe (AIMOVIG) 140 MG/ML SOAJ INJECT 140 MG INTO THE SKIN EVERY 30 DAYS Patient taking differently: Inject 140 mg into the skin every 30 (thirty) days. 06/05/22   Windell Norfolk, MD  escitalopram (LEXAPRO) 20 MG tablet Take 20 mg by mouth daily.    [provider]  ferrous sulfate 325 (65 FE) MG tablet Take 325 mg by mouth daily with breakfast.    [provider]   gabapentin (NEURONTIN) 100 MG capsule Take 100 mg by mouth at bedtime.    [provider]  HUMIRA PEN-CD/UC/HS STARTER 80 MG/0.8ML PNKT Inject 0.8 mLs into the skin every 14 (fourteen) days. 02/10/22   [provider]  hydrOXYzine (ATARAX) 10 MG tablet Take 10 mg by mouth every 8 (eight) hours as needed for itching or anxiety. 07/18/22   [provider]  ibuprofen (ADVIL) 800 MG tablet Take 800 mg by mouth at bedtime.    [provider]  levothyroxine (SYNTHROID) 25 MCG tablet Take 25 mcg by mouth at bedtime.    [provider]  lidocaine (LIDODERM) 5 % Place 1 patch onto the skin daily. Remove & Discard patch within 12 hours or as directed by MD 08/05/22   Ernie Avena, MD  losartan (COZAAR) 50 MG tablet Take 50 mg by mouth daily. 06/20/22   [provider]  ondansetron (ZOFRAN) 4 MG tablet Take 4 mg by mouth every 8 (eight) hours as needed for nausea or vomiting. 07/18/22   [provider]  promethazine (PHENERGAN) 25 MG tablet Take 25 mg by mouth every 6 (six) hours as needed for nausea or vomiting. 07/29/22   [provider]  rosuvastatin (CRESTOR) 10 MG tablet Take 1 tablet (10 mg total) by mouth daily. 07/12/22   Arty Baumgartner, NP  Ubrogepant (UBRELVY) 100 MG TABS Take 100 mg by mouth daily as needed (migraine).    [provider]  valACYclovir (VALTREX) 1000 MG tablet Take 1,000 mg by mouth every 8 (eight) hours as needed (cold sores). 07/23/22   [provider]  venlafaxine XR (EFFEXOR-XR) 150 MG 24 hr capsule Take 150 mg by mouth daily. 07/18/22   [provider]      Allergies    Hydrocodone, Peanut oil, Hydrocodone, and Neosporin [bacitracin-polymyxin b]    Review of Systems   Review of Systems  Constitutional:  Negative for fever.  Cardiovascular:  Positive for leg swelling.  Musculoskeletal:  Positive for myalgias.  Skin:  Positive for color change.    Physical Exam Updated Vital  Signs BP 95/68   Pulse 85   Temp 98.7 F (37.1 C) (Oral)   Resp 18   SpO2 98%  Physical Exam Vitals and nursing note reviewed.  Constitutional:      Appearance: She is well-developed.  HENT:     Head: Normocephalic and atraumatic.  Cardiovascular:     Rate and Rhythm: Normal rate and regular rhythm.     Pulses:          Dorsalis pedis pulses are 2+ on the right side.  Pulmonary:     Effort: Pulmonary effort is normal.  Abdominal:     General: There is no distension.  Musculoskeletal:     Comments: See picture of her right lower leg/lateral calf taken from triage.  On my exam it is a little more erythematous but not sure if this just the way the picture was obtained.  It is fairly firm though on ultrasound I do not see an obvious fluid collection.  It is fairly tender diffusely.  Skin:    General: Skin is warm and dry.  Neurological:     Mental Status: She is alert.     ED Results / Procedures / Treatments   Labs (all labs ordered are listed, but only abnormal results are displayed) Labs Reviewed  BASIC METABOLIC PANEL - Abnormal; Notable for the following components:      Result Value   Glucose, Bld 101 (*)    Anion gap 4 (*)    All other components within normal limits  CULTURE, BLOOD (ROUTINE X 2)  CULTURE, BLOOD (ROUTINE X 2)  CBC WITH DIFFERENTIAL/PLATELET  LACTIC ACID, PLASMA    EKG None  Radiology CT TIBIA FIBULA RIGHT W CONTRAST  Result Date: 08/07/2022 CLINICAL DATA:  Soft tissue infection suspected, lower leg, xray done EXAM: CT OF THE LOWER RIGHT EXTREMITY WITH CONTRAST TECHNIQUE: Multidetector CT imaging of the lower right extremity was performed according to the standard protocol following intravenous contrast administration. RADIATION DOSE REDUCTION: This exam was performed according to the departmental dose-optimization program which includes automated exposure control, adjustment of the mA and/or kV according to patient size and/or use of iterative  reconstruction technique. CONTRAST:  16mL OMNIPAQUE IOHEXOL 300 MG/ML  SOLN COMPARISON:  None Available. No prior radiographs available for review. FINDINGS: Bones/Joint/Cartilage No fracture. No erosion, bony destruction or periosteal reaction. No focal bone abnormality. Knee and ankle alignment are maintained. No definite joint effusions. Ligaments Suboptimally assessed by CT. Muscles and Tendons Normal fatty striations and lower extremity musculature. No intramuscular collection. The Achilles tendon appears to be intact. Patellar tendon is intact. Soft tissues Subcutaneous edema involving the anterolateral aspect of the lower leg from the level of the knee to the mid calf. No discrete fluid collection.  No soft tissue gas. No radiopaque foreign body. IMPRESSION: 1. Subcutaneous edema involving the anterolateral aspect of the lower leg, suspicious for cellulitis. No discrete fluid collection or soft tissue gas. 2. No acute osseous abnormality. Electronically Signed   By: Narda Rutherford M.D.   On: 08/07/2022 18:58    Procedures Ultrasound ED Soft Tissue  Date/Time: 08/07/2022 5:50 PM  Performed by: Pricilla Loveless, MD Authorized by: Pricilla Loveless, MD   Procedure details:    Indications: evaluate for cellulitis     Transverse view:  Visualized   Longitudinal view:  Visualized   Images: archived   Location:    Location: lower extremity     Side:  Right Findings:     no abscess present    cellulitis present     Medications Ordered in ED Medications  vancomycin (VANCOCIN) IVPB 1000 mg/200 mL premix (has no administration in time range)  diphenhydrAMINE-zinc acetate (BENADRYL) 2-0.1 % cream (has no administration in time range)  fentaNYL (SUBLIMAZE) injection 50 mcg (50 mcg Intravenous Given 08/07/22 1721)  lactated ringers bolus 1,000 mL (0 mLs Intravenous Stopped 08/07/22 1847)  vancomycin (VANCOREADY) IVPB 1500 mg/300 mL (0 mg Intravenous Stopped 08/07/22 2052)  iohexol (OMNIPAQUE) 300 MG/ML  solution 75 mL (75 mLs Intravenous Contrast Given 08/07/22 1818)  ketorolac (TORADOL) 15 MG/ML injection 15 mg (15 mg Intravenous Given 08/07/22 2052)    ED Course/ Medical Decision Making/ A&P                           Medical Decision Making Amount and/or Complexity of Data Reviewed Labs: ordered.    Details: WBC, electrolytes, and lactate are okay. Radiology: ordered and independent interpretation performed.    Details: CT shows some cellulitis but no abscess or obvious deep space infection/gas  Risk OTC drugs. Prescription drug management.   Patient appears to have cellulitis.  It is significantly worse than what was described over the last day or 2.  It is fairly erythematous and firm but no crepitance.  Given her degree of pain a CT was obtained and while this obviously cannot fully rule out necrotizing fasciitis, she otherwise is well-appearing with no fever, normal WBC, etc.  Lactate is normal.  She is resting comfortably and playing on her phone.  She was given IV vancomycin.  She had a couple soft blood pressures but no hypotension so the lactate was sent but otherwise she is well-appearing.  Overall I think it was important thing is that she has not been taking her Keflex correctly.  She is essentially taking it 25% of what she should be.  Discussed the importance of taking this correctly and offered to change her to a medicine that would be more twice daily dosing but she declines and wants to take the Keflex and states she will take it correctly.  Will discharge home with return precautions and follow-up with PCP in the next couple days for cellulitis check.       Final Clinical Impression(s) / ED Diagnoses Final diagnoses:  Cellulitis of right lower extremity    Rx / DC Orders ED Discharge Orders     None         Pricilla Loveless, MD 08/07/22 2340

## 2022-08-07 NOTE — Discharge Instructions (Signed)
It is very important to take the antibiotics as instructed, in your case 4 times per day.  If you notice fever, new or worsening redness or pain, or any other new/concerning symptoms then return to the ER or call 911.  Otherwise follow-up with your primary care physician to get your leg rechecked.

## 2022-08-07 NOTE — Discharge Instructions (Addendum)
Continue the cephalexin as prescribed.  Take Zyrtec as directed.  Follow up with your primary care provider tomorrow.  Go to the emergency department if your symptoms worsen.

## 2022-08-07 NOTE — ED Provider Notes (Signed)
Brianna [name]    CSN: [id number] Arrival date & time: 08/07/22  0909      History   Chief Complaint Chief Complaint  Patient presents with   Insect Bite    HPI Brianna [name] is a 41 y.o. female.  Patient presents with insect bite on her right leg x 1 day.  Today the area is painful, red, and swollen.  No fever, chills, wound drainage, or other symptoms.  Patient was seen at [facility] ED on 08/05/2022 for chest pain but was also evaluated for cellulitis of her left foot; the cellulitis is being treated with cephalexin.  Her medical history includes hypertension, hypothyroidism, seizure disorder, bipolar affective disorder, anxiety, migraine headaches, asthma, IBS, chronic low back pain, history of left nephrectomy.  The history is provided by the patient and medical records.    Past Medical History:  Diagnosis Date   Bipolar affective (HCC)    Hypertension    Hypothyroidism    Seizure disorder [facility])     Patient Active Problem List   Diagnosis Date Noted   Hyperlipidemia 07/12/2022   Hypertension 07/12/2022   Chest pain 07/11/2022   Generalized anxiety disorder 03/03/2021   History of nephrectomy 09/14/2020   Irritable bowel syndrome with diarrhea 09/14/2020   Migraine headache 09/14/2020   Seizure-like activity (HCC) 09/14/2020   Mild intermittent asthma without complication 04/28/2020   Cough variant asthma 06/12/2019   Bipolar affective (HCC) 07/09/2017   Chronic midline low back pain with right-sided sciatica 07/09/2017   Long term current use of antipsychotic medication 07/09/2017    Past Surgical History:  Procedure Laterality Date   ABDOMINAL HYSTERECTOMY     CHOLECYSTECTOMY     COLONOSCOPY WITH PROPOFOL N/A 10/05/2021   Procedure: COLONOSCOPY WITH PROPOFOL;  Surgeon: Wyline Mood, MD;  Location: [facility] ENDOSCOPY;  Service: Gastroenterology;  Laterality: N/A;   NEPHRECTOMY Left     OB History   No obstetric history on file.      Home Medications     Prior to Admission medications   Medication Sig Start Date End Date Taking? Authorizing Provider  amitriptyline (ELAVIL) 25 MG tablet Take 25 mg by mouth daily. 07/07/22   [provider]  cephALEXin (KEFLEX) 500 MG capsule Take 1 capsule (500 mg total) by mouth 4 (four) times daily. 08/05/22   Ernie Avena, MD  doxycycline (ADOXA) 100 MG tablet He is 1 twice a day for the first week then start taking 1/day until seen by your dermatologist. Patient not taking: Reported on 08/05/2022 01/30/22   Sherrie Mustache, Roselyn Bering, PA-C  Erenumab-aooe (AIMOVIG) 140 MG/ML SOAJ INJECT 140 MG INTO THE SKIN EVERY 30 DAYS Patient taking differently: Inject 140 mg into the skin every 30 (thirty) days. 06/05/22   Windell Norfolk, MD  escitalopram (LEXAPRO) 20 MG tablet Take 20 mg by mouth daily.    [provider]  ferrous sulfate 325 (65 FE) MG tablet Take 325 mg by mouth daily with breakfast.    [provider]  gabapentin (NEURONTIN) 100 MG capsule Take 100 mg by mouth at bedtime.    [provider]  HUMIRA PEN-CD/UC/HS STARTER 80 MG/0.8ML PNKT Inject 0.8 mLs into the skin every 14 (fourteen) days. 02/10/22   [provider]  hydrOXYzine (ATARAX) 10 MG tablet Take 10 mg by mouth every 8 (eight) hours as needed for itching or anxiety. 07/18/22   [provider]  ibuprofen (ADVIL) 800 MG tablet Take 800 mg by mouth at bedtime.  [provider]  levothyroxine (SYNTHROID) 25 MCG tablet Take 25 mcg by mouth at bedtime.    [provider]  lidocaine (LIDODERM) 5 % Place 1 patch onto the skin daily. Remove & Discard patch within 12 hours or as directed by MD 08/05/22   Ernie Avena, MD  losartan (COZAAR) 50 MG tablet Take 50 mg by mouth daily. 06/20/22   [provider]  ondansetron (ZOFRAN) 4 MG tablet Take 4 mg by mouth every 8 (eight) hours as needed for nausea or vomiting. 07/18/22   [provider]  promethazine (PHENERGAN) 25 MG tablet Take  25 mg by mouth every 6 (six) hours as needed for nausea or vomiting. 07/29/22   [provider]  rosuvastatin (CRESTOR) 10 MG tablet Take 1 tablet (10 mg total) by mouth daily. 07/12/22   Arty Baumgartner, NP  Ubrogepant (UBRELVY) 100 MG TABS Take 100 mg by mouth daily as needed (migraine).    [provider]  valACYclovir (VALTREX) 1000 MG tablet Take 1,000 mg by mouth every 8 (eight) hours as needed (cold sores). 07/23/22   [provider]  venlafaxine XR (EFFEXOR-XR) 150 MG 24 hr capsule Take 150 mg by mouth daily. 07/18/22   [provider]    Family History Family History  Problem Relation Age of Onset   Heart attack Maternal Grandfather        Deceased   Heart attack Paternal Grandmother    Heart attack Paternal Grandfather     Social History Social History   Tobacco Use   Smoking status: Never   Smokeless tobacco: Never  Vaping Use   Vaping Use: Never used  Substance Use Topics   Alcohol use: Not Currently   Drug use: Not Currently     Allergies   Hydrocodone, Peanut oil, Hydrocodone, and Neosporin [bacitracin-polymyxin b]   Review of Systems Review of Systems  Constitutional:  Negative for chills and fever.  Musculoskeletal:  Negative for arthralgias and gait problem.  Skin:  Positive for color change and wound.  Neurological:  Negative for weakness and numbness.  All other systems reviewed and are negative.    Physical Exam Triage Vital Signs ED Triage Vitals  Enc Vitals Group     BP      Pulse      Resp      Temp      Temp src      SpO2      Weight      Height      Head Circumference      Peak Flow      Pain Score      Pain Loc      Pain Edu?      Excl. in GC?    No data found.  Updated Vital Signs BP 124/86   Pulse 98   Temp 97.9 F (36.6 C)   Resp 18   Ht  (1.499 m)   Wt 180 lb (81.6 kg)   SpO2 98%   BMI 36.36 kg/m   Visual Acuity Right Eye Distance:   Left Eye Distance:   Bilateral  Distance:    Right Eye Near:   Left Eye Near:    Bilateral Near:     Physical Exam Vitals and nursing note reviewed.  Constitutional:      General: She is not in acute distress.    Appearance: She is well-developed. She is not ill-appearing.  HENT:     Mouth/Throat:  Mouth: Mucous membranes are moist.  Cardiovascular:     Rate and Rhythm: Normal rate and regular rhythm.     Heart sounds: Normal heart sounds.  Pulmonary:     Effort: Pulmonary effort is normal. No respiratory distress.     Breath sounds: Normal breath sounds.  Musculoskeletal:        General: Swelling and tenderness present. No deformity. Normal range of motion.     Cervical back: Neck supple.  Skin:    General: Skin is warm and dry.     Capillary Refill: Capillary refill takes less than 2 seconds.     Findings: Erythema and lesion present.     Comments: Small circular area of dark erythema with surrounding light erythema.  See pictures.   Neurological:     Mental Status: She is alert.     Sensory: No sensory deficit.     Motor: No weakness.     Gait: Gait normal.  Psychiatric:        Mood and Affect: Mood normal.        Behavior: Behavior normal.         UC Treatments / Results  Labs (all labs ordered are listed, but only abnormal results are displayed) Labs Reviewed - No data to display  EKG   Radiology MR BRAIN WO CONTRAST  Result Date: 08/05/2022 CLINICAL DATA:  Transient ischemic attack (TIA). Bilateral arm numbness. EXAM: MRI HEAD WITHOUT CONTRAST MRA HEAD WITHOUT CONTRAST MRA OF THE NECK WITHOUT AND WITH CONTRAST TECHNIQUE: Multiplanar, multi-echo pulse sequences of the brain and surrounding structures were acquired without intravenous contrast. Angiographic images of the Circle of Willis were acquired using MRA technique without intravenous contrast. Angiographic images of the neck were acquired using MRA technique without and with intravenous contrast. Carotid stenosis measurements (when  applicable) are obtained utilizing NASCET criteria, using the distal internal carotid diameter as the denominator. CONTRAST:  31mL GADAVIST GADOBUTROL 1 MMOL/ML IV SOLN COMPARISON:  Head CT 08/05/2022 FINDINGS: MR HEAD FINDINGS The study is mildly motion degraded. Brain: There is no evidence of an acute infarct, intracranial hemorrhage, mass, midline shift, or extra-axial fluid collection. A chronic lacunar infarct is present anteriorly in the right corona radiata. The ventricles and sulci are normal. A partially empty sella is noted. Vascular: Major intracranial vascular flow voids are preserved. Skull and upper cervical spine: Unremarkable bone marrow signal. Sinuses/Orbits: Unremarkable orbits. Paranasal sinuses and mastoid air cells are clear. Other: None. MRA HEAD FINDINGS The study is moderately motion degraded. Anterior circulation: The internal carotid arteries are patent from skull base to carotid termini with motion artifact limiting assessment for stenosis, however no significant intracranial ICA stenosis is evident on the contrast-enhanced neck MRA. ACAs and MCAs are patent without evidence of a proximal branch occlusion. No sizable aneurysm is identified. Posterior circulation: The intracranial vertebral arteries and the basilar artery are patent with motion artifact limiting assessment for stenosis, however no high-grade stenosis is evident on the contrast-enhanced neck MRA. Patent left PICA and bilateral SCA origins are identified. There are large posterior communicating arteries with hypoplastic P1 segments bilaterally. Both PCAs are patent without evidence of a significant proximal stenosis. No sizable aneurysm is identified. Anatomic variants: Fetal origin of the PCAs. MRA NECK FINDINGS The study is moderately motion degraded. Aortic arch: Standard 3 vessel aortic arch with widely patent arch vessel origins. Right carotid system: Patent with assessment of the proximal common carotid artery and  carotid bifurcation limited by motion artifact. No evidence of  a significant stenosis or dissection elsewhere. Left carotid system: Patent without evidence of a significant stenosis or dissection. Vertebral arteries: The vertebral arteries are poorly evaluated on the contrast-enhanced MRA due to venous contamination. On the noncontrast time-of-flight MRA, the proximal V1 segments are poorly seen due to motion artifact, however the vertebral arteries are patent more distally with antegrade flow bilaterally. IMPRESSION: 1. No acute intracranial abnormality. 2. 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 3. Motion degraded head and neck MRAs. No large vessel occlusion. Limited assessment for stenosis. Electronically Signed   By: Sebastian Ache M.D.   On: 08/05/2022 19:31   MR Angiogram Neck W or Wo Contrast  Result Date: 08/05/2022 CLINICAL DATA:  Transient ischemic attack (TIA). Bilateral arm numbness. EXAM: MRI HEAD WITHOUT CONTRAST MRA HEAD WITHOUT CONTRAST MRA OF THE NECK WITHOUT AND WITH CONTRAST TECHNIQUE: Multiplanar, multi-echo pulse sequences of the brain and surrounding structures were acquired without intravenous contrast. Angiographic images of the Circle of Willis were acquired using MRA technique without intravenous contrast. Angiographic images of the neck were acquired using MRA technique without and with intravenous contrast. Carotid stenosis measurements (when applicable) are obtained utilizing NASCET criteria, using the distal internal carotid diameter as the denominator. CONTRAST:  77mL GADAVIST GADOBUTROL 1 MMOL/ML IV SOLN COMPARISON:  Head CT 08/05/2022 FINDINGS: MR HEAD FINDINGS The study is mildly motion degraded. Brain: There is no evidence of an acute infarct, intracranial hemorrhage, mass, midline shift, or extra-axial fluid collection. A chronic lacunar infarct is present anteriorly in the right corona radiata. The ventricles and sulci are normal. A partially empty sella is noted.  Vascular: Major intracranial vascular flow voids are preserved. Skull and upper cervical spine: Unremarkable bone marrow signal. Sinuses/Orbits: Unremarkable orbits. Paranasal sinuses and mastoid air cells are clear. Other: None. MRA HEAD FINDINGS The study is moderately motion degraded. Anterior circulation: The internal carotid arteries are patent from skull base to carotid termini with motion artifact limiting assessment for stenosis, however no significant intracranial ICA stenosis is evident on the contrast-enhanced neck MRA. ACAs and MCAs are patent without evidence of a proximal branch occlusion. No sizable aneurysm is identified. Posterior circulation: The intracranial vertebral arteries and the basilar artery are patent with motion artifact limiting assessment for stenosis, however no high-grade stenosis is evident on the contrast-enhanced neck MRA. Patent left PICA and bilateral SCA origins are identified. There are large posterior communicating arteries with hypoplastic P1 segments bilaterally. Both PCAs are patent without evidence of a significant proximal stenosis. No sizable aneurysm is identified. Anatomic variants: Fetal origin of the PCAs. MRA NECK FINDINGS The study is moderately motion degraded. Aortic arch: Standard 3 vessel aortic arch with widely patent arch vessel origins. Right carotid system: Patent with assessment of the proximal common carotid artery and carotid bifurcation limited by motion artifact. No evidence of a significant stenosis or dissection elsewhere. Left carotid system: Patent without evidence of a significant stenosis or dissection. Vertebral arteries: The vertebral arteries are poorly evaluated on the contrast-enhanced MRA due to venous contamination. On the noncontrast time-of-flight MRA, the proximal V1 segments are poorly seen due to motion artifact, however the vertebral arteries are patent more distally with antegrade flow bilaterally. IMPRESSION: 1. No acute  intracranial abnormality. 2. 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 3. Motion degraded head and neck MRAs. No large vessel occlusion. Limited assessment for stenosis. Electronically Signed   By: Sebastian Ache M.D.   On: 08/05/2022 19:31   MR ANGIO HEAD WO CONTRAST  Result Date: 08/05/2022 CLINICAL DATA:  Transient ischemic attack (TIA). Bilateral arm numbness. EXAM: MRI HEAD WITHOUT CONTRAST MRA HEAD WITHOUT CONTRAST MRA OF THE NECK WITHOUT AND WITH CONTRAST TECHNIQUE: Multiplanar, multi-echo pulse sequences of the brain and surrounding structures were acquired without intravenous contrast. Angiographic images of the Circle of Willis were acquired using MRA technique without intravenous contrast. Angiographic images of the neck were acquired using MRA technique without and with intravenous contrast. Carotid stenosis measurements (when applicable) are obtained utilizing NASCET criteria, using the distal internal carotid diameter as the denominator. CONTRAST:  8mL GADAVIST GADOBUTROL 1 MMOL/ML IV SOLN COMPARISON:  Head CT 08/05/2022 FINDINGS: MR HEAD FINDINGS The study is mildly motion degraded. Brain: There is no evidence of an acute infarct, intracranial hemorrhage, mass, midline shift, or extra-axial fluid collection. A chronic lacunar infarct is present anteriorly in the right corona radiata. The ventricles and sulci are normal. A partially empty sella is noted. Vascular: Major intracranial vascular flow voids are preserved. Skull and upper cervical spine: Unremarkable bone marrow signal. Sinuses/Orbits: Unremarkable orbits. Paranasal sinuses and mastoid air cells are clear. Other: None. MRA HEAD FINDINGS The study is moderately motion degraded. Anterior circulation: The internal carotid arteries are patent from skull base to carotid termini with motion artifact limiting assessment for stenosis, however no significant intracranial ICA stenosis is evident on the contrast-enhanced neck MRA. ACAs and  MCAs are patent without evidence of a proximal branch occlusion. No sizable aneurysm is identified. Posterior circulation: The intracranial vertebral arteries and the basilar artery are patent with motion artifact limiting assessment for stenosis, however no high-grade stenosis is evident on the contrast-enhanced neck MRA. Patent left PICA and bilateral SCA origins are identified. There are large posterior communicating arteries with hypoplastic P1 segments bilaterally. Both PCAs are patent without evidence of a significant proximal stenosis. No sizable aneurysm is identified. Anatomic variants: Fetal origin of the PCAs. MRA NECK FINDINGS The study is moderately motion degraded. Aortic arch: Standard 3 vessel aortic arch with widely patent arch vessel origins. Right carotid system: Patent with assessment of the proximal common carotid artery and carotid bifurcation limited by motion artifact. No evidence of a significant stenosis or dissection elsewhere. Left carotid system: Patent without evidence of a significant stenosis or dissection. Vertebral arteries: The vertebral arteries are poorly evaluated on the contrast-enhanced MRA due to venous contamination. On the noncontrast time-of-flight MRA, the proximal V1 segments are poorly seen due to motion artifact, however the vertebral arteries are patent more distally with antegrade flow bilaterally. IMPRESSION: 1. No acute intracranial abnormality. 2. 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 3. Motion degraded head and neck MRAs. No large vessel occlusion. Limited assessment for stenosis. Electronically Signed   By: Sebastian AcheAllen  Grady M.D.   On: 08/05/2022 19:31   CT HEAD WO CONTRAST (5MM)  Result Date: 08/05/2022 CLINICAL DATA:  TIA, bilateral arm numbness EXAM: CT HEAD WITHOUT CONTRAST TECHNIQUE: Contiguous axial images were obtained from the base of the skull through the vertex without intravenous contrast. RADIATION DOSE REDUCTION: This exam was performed  according to the departmental dose-optimization program which includes automated exposure control, adjustment of the mA and/or kV according to patient size and/or use of iterative reconstruction technique. COMPARISON:  None Available. FINDINGS: Brain: No evidence of acute infarction, hemorrhage, hydrocephalus, extra-axial collection or mass lesion/mass effect. Vascular: No hyperdense vessel or unexpected calcification. Skull: Normal. Negative for fracture or focal lesion. Sinuses/Orbits: No acute finding. Other: None. IMPRESSION: No acute intracranial pathology. No noncontrast CT findings  to explain arm numbness. Consider MRI to more sensitively evaluate for acute diffusion restricting infarction if clinically suspected. Electronically Signed   By: Jearld Lesch M.D.   On: 08/05/2022 13:07   DG Chest Portable 1 View  Result Date: 08/05/2022 CLINICAL DATA:  Chest pain and shortness of breath. EXAM: PORTABLE CHEST 1 VIEW COMPARISON:  07/18/2022 FINDINGS: Heart size and mediastinal contours are unremarkable. There is no pleural effusion or edema identified. No airspace opacities identified. The visualized osseous structures are unremarkable. IMPRESSION: No active disease. Electronically Signed   By: Signa Kell M.D.   On: 08/05/2022 12:11    Procedures Procedures (including critical care time)  Medications Ordered in UC Medications - No data to display  Initial Impression / Assessment and Plan / UC Course  I have reviewed the triage vital signs and the nursing notes.  Pertinent labs & imaging results that were available during my care of the patient were reviewed by me and considered in my medical decision making (see chart for details).   Cellulitis of right leg due to insect bite.  Patient is currently on cephalexin since 08/05/2022.  Instructed her to continue the medication.  Discussed Zyrtec also.  Instructed patient to follow up with her PCP tomorrow.  ED precautions discussed.  She agrees to  plan of care.     Final Clinical Impressions(s) / UC Diagnoses   Final diagnoses:  Cellulitis of leg, right  Insect bite of right lower leg, initial encounter     Discharge Instructions      Continue the cephalexin as prescribed.  Take Zyrtec as directed.  Follow up with your primary care provider tomorrow.  Go to the emergency department if your symptoms worsen.       ED Prescriptions   None    PDMP not reviewed this encounter.   Mickie Bail, NP 08/07/22 [phone]

## 2022-08-07 NOTE — ED Triage Notes (Signed)
Pt c/o spider bite to RLE; first noticed last night, increased swelling/ pain since. Seen at [facility] for same today.

## 2022-08-07 NOTE — ED Notes (Signed)
Pt verbalized understanding of d/c instructions, meds, and followup care. Denies questions. VSS, no distress noted. Steady gait to exit with all belongings.  ?

## 2022-08-07 NOTE — ED Provider Triage Note (Signed)
Emergency Medicine Provider Triage Evaluation Note  Brianna [name] , a 41 y.o. female  was evaluated in triage.  Pt complains of right lower extremity skin changes.  Think she was bit by a bug yesterday, on Keflex for a different condition.  Seen in urgent care, Advised to continue the Keflex but "go to ED for IV antibiotics if the redness spreads or worsens".  Patient thinks it spread and worsened since this morning when she was seen by urgent care.  Not having any fevers or chills..  Review of Systems  Per HPI  Physical Exam  BP 124/72 (BP Location: Right Arm)   Pulse 86   Temp 98.5 F (36.9 C) (Oral)   Resp 16   SpO2 95%  Gen:   Awake, no distress   Resp:  Normal effort  MSK:   Moves extremities without difficulty  Other:  DP PT 2+.      Medical Decision Making  Medically screening exam initiated at 3:37 PM.  Appropriate orders placed.  Brianna [name] was informed that the remainder of the evaluation will be completed by another provider, this initial triage assessment does not replace that evaluation, and the importance of remaining in the ED until their evaluation is complete.  Does not meet SIRS criteria.    Theron Arista, PA-C 08/07/22 1539

## 2022-08-10 ENCOUNTER — Emergency Department
Admission: EM | Admit: 2022-08-10 | Discharge: 2022-08-10 | Disposition: A | Discharge status: Home / Self Care | Payer: Medicare Other | Attending: Emergency Medicine | Admitting: Emergency Medicine

## 2022-08-10 ENCOUNTER — Other Ambulatory Visit: Payer: Self-pay

## 2022-08-10 ENCOUNTER — Encounter: Payer: Self-pay | Admitting: Neurology

## 2022-08-10 ENCOUNTER — Institutional Professional Consult (permissible substitution): Payer: 59 | Admitting: Neurology

## 2022-08-10 ENCOUNTER — Emergency Department: Payer: Medicare Other

## 2022-08-10 ENCOUNTER — Institutional Professional Consult (permissible substitution): Payer: Medicare Other | Admitting: Neurology

## 2022-08-10 DIAGNOSIS — R791 Abnormal coagulation profile: Secondary | ICD-10-CM | POA: Insufficient documentation

## 2022-08-10 DIAGNOSIS — R0789 Other chest pain: Secondary | ICD-10-CM | POA: Insufficient documentation

## 2022-08-10 DIAGNOSIS — I10 Essential (primary) hypertension: Secondary | ICD-10-CM | POA: Diagnosis not present

## 2022-08-10 DIAGNOSIS — R1012 Left upper quadrant pain: Secondary | ICD-10-CM | POA: Insufficient documentation

## 2022-08-10 LAB — CBC
HCT: 38.2 % (ref 36.0–46.0)
Hemoglobin: 12.4 g/dL (ref 12.0–15.0)
MCH: 29.7 pg (ref 26.0–34.0)
MCHC: 32.5 g/dL (ref 30.0–36.0)
MCV: 91.4 fL (ref 80.0–100.0)
Platelets: 282 10*3/uL (ref 150–400)
RBC: 4.18 MIL/uL (ref 3.87–5.11)
RDW: 12.7 % (ref 11.5–15.5)
WBC: 7.9 10*3/uL (ref 4.0–10.5)
nRBC: 0 % (ref 0.0–0.2)

## 2022-08-10 LAB — HEPATIC FUNCTION PANEL
ALT: 20 U/L (ref 0–44)
AST: 17 U/L (ref 15–41)
Albumin: 3.6 g/dL (ref 3.5–5.0)
Alkaline Phosphatase: 66 U/L (ref 38–126)
Bilirubin, Direct: <0.1 mg/dL (ref 0.0–0.2)
Total Bilirubin: 0.4 mg/dL (ref 0.3–1.2)
Total Protein: 6.2 g/dL — ABNORMAL LOW (ref 6.5–8.1)

## 2022-08-10 LAB — LIPASE, BLOOD: Lipase: 24 U/L (ref 11–51)

## 2022-08-10 LAB — BASIC METABOLIC PANEL
Anion gap: 6 (ref 5–15)
BUN: 7 mg/dL (ref 6–20)
CO2: 24 mmol/L (ref 22–32)
Calcium: 8.7 mg/dL — ABNORMAL LOW (ref 8.9–10.3)
Chloride: 108 mmol/L (ref 98–111)
Creatinine, Ser: 0.93 mg/dL (ref 0.44–1.00)
GFR, Estimated: >60 mL/min (ref >60)
Glucose, Bld: 111 mg/dL — ABNORMAL HIGH (ref 70–99)
Potassium: 3.7 mmol/L (ref 3.5–5.1)
Sodium: 138 mmol/L (ref 135–145)

## 2022-08-10 LAB — TROPONIN I (HIGH SENSITIVITY)
Troponin I (High Sensitivity): <2 ng/L (ref <18)
Troponin I (High Sensitivity): 2 ng/L (ref <18)

## 2022-08-10 LAB — D-DIMER, QUANTITATIVE: D-Dimer, Quant: 0.62 ug/mL-FEU — ABNORMAL HIGH (ref 0.00–0.50)

## 2022-08-10 MED ORDER — KETOROLAC TROMETHAMINE 15 MG/ML IJ SOLN
15.0000 mg | Freq: Once | INTRAMUSCULAR | Status: DC
  Filled 2022-08-10: qty 1

## 2022-08-10 MED ORDER — LIDOCAINE 5 % EX PTCH
1.0000 | MEDICATED_PATCH | CUTANEOUS | Status: DC
  Administered 2022-08-10: 1 via TRANSDERMAL
  Filled 2022-08-10: qty 1

## 2022-08-10 MED ORDER — ACETAMINOPHEN 500 MG PO TABS
1000.0000 mg | ORAL_TABLET | Freq: Once | ORAL | Status: AC
Start: 2022-08-10 — End: 2022-08-10
  Administered 2022-08-10: 1000 mg via ORAL
  Filled 2022-08-10: qty 2

## 2022-08-10 MED ORDER — IOHEXOL 350 MG/ML SOLN
75.0000 mL | Freq: Once | INTRAVENOUS | Status: AC | PRN
  Administered 2022-08-10: 75 mL via INTRAVENOUS

## 2022-08-10 MED ORDER — KETOROLAC TROMETHAMINE 30 MG/ML IJ SOLN
30.0000 mg | Freq: Once | INTRAMUSCULAR | Status: AC
  Administered 2022-08-10: 30 mg via INTRAMUSCULAR
  Filled 2022-08-10: qty 1

## 2022-08-10 MED ORDER — LIDOCAINE 5 % EX PTCH: 1.0000 | MEDICATED_PATCH | Freq: Two times a day (BID) | CUTANEOUS | 0 refills | Status: AC

## 2022-08-10 MED ORDER — LIDOCAINE 5 % EX PTCH: 1.0000 | MEDICATED_PATCH | Freq: Two times a day (BID) | CUTANEOUS | 0 refills | Status: DC

## 2022-08-10 NOTE — ED Triage Notes (Signed)
Pt arrives with c/o chest pain that started this morning. Per pt, she has been having episodes of CP that have been worked up in the past 6 weeks. Pt denies n/v. Pt endorses SOB.

## 2022-08-10 NOTE — ED Notes (Signed)
E signature pad not working. Pt educated on discharge instructions and verbalized understanding.  

## 2022-08-10 NOTE — Discharge Instructions (Signed)
Work-up was reassuring.  Incidental findings noted below.  Return to the ER if develop worsening pain fevers or any other concerns otherwise you can call cardiology make a follow-up appointment    IMPRESSION:  1. No acute findings in the abdomen pelvis.  2. Postcholecystectomy.  3. Normal appendix.  4. Benign cyst LEFT ovary.

## 2022-08-10 NOTE — ED Provider Notes (Signed)
[facility] Provider Note    Event Date/Time   First MD Initiated Contact with Patient 08/10/22 1406     (approximate)   History   Chest Pain   HPI  CADYN RODGER is a 41 y.o. female who comes in with concerns for chest pain.  Patient has a history of bipolar, anxiety, hypertension who comes in with chest pain.  Reviewing outside records she has had extensive cardiology work-up including CTA coronary angiogram which was normal with a calcium score of 0.  She is also had echocardiogram that is been reassuring..  I reviewed the cardiology note from 07/11/2022  Patient reports having this off and on pain in her left upper quadrant that is stabbing sensation and goes into her back.  She reports that this is the same type of pain but it is a little bit stronger than prior.  She reports being seen by cardiology but never having a cath or stress test.  She does report some shortness of breath although has no other symptoms associated with it otherwise.  She was recently on antibiotics for a leg cellulitis but she reports that has since resolved.   Physical Exam   Triage Vital Signs: ED Triage Vitals  Enc Vitals Group     BP 08/10/22 1142 116/67     Pulse Rate 08/10/22 1142 89     Resp 08/10/22 1142 16     Temp 08/10/22 1142 98.5 F (36.9 C)     Temp Source 08/10/22 1142 Oral     SpO2 08/10/22 1142 100 %     Weight 08/10/22 1140 180 lb (81.6 kg)     Height 08/10/22 1140 4\' 11"  (1.499 m)     Head Circumference --      Peak Flow --      Pain Score 08/10/22 1140 6     Pain Loc --      Pain Edu? --      Excl. in GC? --     Most recent vital signs: Vitals:   08/10/22 1142  BP: 116/67  Pulse: 89  Resp: 16  Temp: 98.5 F (36.9 C)  SpO2: 100%     General: Awake, no distress.  CV:  Good peripheral perfusion.  Resp:  Normal effort.  Abd:  No distention.  Tender in the left upper quadrant in the left chest area without any rashes noted. Other:  No  swelling the leg.  No calf tenderness.  No cellulitis at this time.   ED Results / Procedures / Treatments   Labs (all labs ordered are listed, but only abnormal results are displayed) Labs Reviewed  BASIC METABOLIC PANEL - Abnormal; Notable for the following components:      Result Value   Glucose, Bld 111 (*)    Calcium 8.7 (*)    All other components within normal limits  CBC  TROPONIN I (HIGH SENSITIVITY)  TROPONIN I (HIGH SENSITIVITY)     EKG  My interpretation of EKG:  Normal sinus rate of 82 without any ST elevation, T wave inversion in V2, normal intervals  RADIOLOGY I have reviewed the xray personally and interpreted no evidence of pneumonia  PROCEDURES:  Critical Care performed: No  Procedures   MEDICATIONS ORDERED IN ED: Medications - No data to display   IMPRESSION / MDM / ASSESSMENT AND PLAN / ED COURSE  I reviewed the triage vital signs and the nursing notes.   Patient's presentation is most consistent with acute  presentation with potential threat to life or bodily function.   Differential is ACS, PE, pancreatitis, gallbladder pathology.  Troponins were negative x2.  D-dimer slightly elevated.  Lipase and hepatic function overall reassuring.  Proceed with CT imaging given the abdominal pain will get CT of the abdomen as well.  CT imaging does show a small benign cyst in the left ovary but her pain is in the left upper abdomen.  She also has no evidence of pulmonary embolism.  Reevaluated patient updated on results.  Considered admission but given negative work-up patient can follow-up outpatient with cardiology.  Patient is requesting a new cardiologist to follow-up with.  Patient also requesting a prescription for lidocaine patches.  This time she feels comfortable with discharge home     FINAL CLINICAL IMPRESSION(S) / ED DIAGNOSES   Final diagnoses:  Atypical chest pain     Rx / DC Orders   ED Discharge Orders          Ordered     lidocaine (LIDODERM) 5 %  Every 12 hours        08/10/22 1741             Note:  This document was prepared using Dragon voice recognition software and may include unintentional dictation errors.   Concha Se, MD 08/10/22 [phone]

## 2022-08-10 NOTE — ED Notes (Signed)
Patient transported to CT 

## 2022-08-12 LAB — CULTURE, BLOOD (ROUTINE X 2)
Culture: NO GROWTH
Culture: NO GROWTH
Special Requests: ADEQUATE

## 2022-08-14 ENCOUNTER — Encounter: Payer: Self-pay | Admitting: Internal Medicine

## 2022-08-14 ENCOUNTER — Ambulatory Visit: Payer: Medicare Other | Attending: Internal Medicine | Admitting: Internal Medicine

## 2022-08-14 VITALS — BP 117/81 | HR 79 | Ht 59.0 in | Wt 185.6 lb

## 2022-08-14 DIAGNOSIS — R0789 Other chest pain: Secondary | ICD-10-CM | POA: Diagnosis not present

## 2022-08-14 NOTE — Patient Instructions (Signed)
Medication Instructions:  Your physician recommends that you continue on your current medications as directed. Please refer to the Current Medication list given to you today.  *If you need a refill on your cardiac medications before your next appointment, please call your pharmacy*   Follow-Up: At [facility], you and your health needs are our priority.  As part of our continuing mission to provide you with exceptional heart care, we have created designated Provider Care Teams.  These Care Teams include your primary Cardiologist (physician) and Advanced Practice Providers (APPs -  Physician Assistants and Nurse Practitioners) who all work together to provide you with the care you need, when you need it.  We recommend signing up for the patient portal called "MyChart".  Sign up information is provided on this After Visit Summary.  MyChart is used to connect with patients for Virtual Visits (Telemedicine).  Patients are able to view lab/test results, encounter notes, upcoming appointments, etc.  Non-urgent messages can be sent to your provider as well.   To learn more about what you can do with MyChart, go to [email or web address].    Your next appointment:   We will see you on an as needed basis.  Provider:   Mary Branch, MD 

## 2022-08-14 NOTE — Progress Notes (Signed)
Cardiology Office Note:    Date:  08/14/2022   ID:  Brianna [name], DOB 09-02-1981, MRN [id number]  PCP:  System, Provider Not In   [facility] Providers Cardiologist:  Nanetta Batty, MD     Referring MD: Ernie Avena, MD   No chief complaint on file. Non cardiac CP  History of Present Illness:    Brianna [name] is a 41 y.o. female with a hx of bipolar, seizure disorder, CTA coronary angiogram which the thyroid dx, hydradenitis, suppurativa, was normal with a calcium score of 0, echo 07/11/2022- normal LV function, mild MR. She has persistent palpitations. No recent syncopal episodes. She came here as a self referral, feels blown off. No family hx of cardiac dx. She is irate. Notes multiple ED visits for pan positive pain. TSH is normal.   Past Medical History:  Diagnosis Date   Bipolar affective (HCC)    Hypertension    Hypothyroidism    Seizure disorder [facility])     Past Surgical History:  Procedure Laterality Date   ABDOMINAL HYSTERECTOMY     CHOLECYSTECTOMY     COLONOSCOPY WITH PROPOFOL N/A 10/05/2021   Procedure: COLONOSCOPY WITH PROPOFOL;  Surgeon: Wyline Mood, MD;  Location: [facility] ENDOSCOPY;  Service: Gastroenterology;  Laterality: N/A;   NEPHRECTOMY Left     Current Medications: Current Meds  Medication Sig   amitriptyline (ELAVIL) 25 MG tablet Take 25 mg by mouth daily.   cephALEXin (KEFLEX) 500 MG capsule Take 1 capsule (500 mg total) by mouth 4 (four) times daily.   Erenumab-aooe (AIMOVIG) 140 MG/ML SOAJ INJECT 140 MG INTO THE SKIN EVERY 30 DAYS (Patient taking differently: Inject 140 mg into the skin every 30 (thirty) days.)   escitalopram (LEXAPRO) 20 MG tablet Take 20 mg by mouth daily.   ferrous sulfate 325 (65 FE) MG tablet Take 325 mg by mouth daily with breakfast.   gabapentin (NEURONTIN) 100 MG capsule Take 100 mg by mouth at bedtime.   HUMIRA PEN-CD/UC/HS STARTER 80 MG/0.8ML PNKT Inject 0.8 mLs into the skin every 14 (fourteen) days.    hydrOXYzine (ATARAX) 10 MG tablet Take 10 mg by mouth every 8 (eight) hours as needed for itching or anxiety.   ibuprofen (ADVIL) 800 MG tablet Take 800 mg by mouth at bedtime.   levothyroxine (SYNTHROID) 25 MCG tablet Take 25 mcg by mouth at bedtime.   lidocaine (LIDODERM) 5 % Place 1 patch onto the skin every 12 (twelve) hours for 5 days. Remove & Discard patch within 12 hours or as directed by MD and leave off 12 hours before placing new patch   lisinopril (ZESTRIL) 5 MG tablet Take 5 mg by mouth daily.   losartan (COZAAR) 50 MG tablet Take 50 mg by mouth daily.   ondansetron (ZOFRAN) 4 MG tablet Take 4 mg by mouth every 8 (eight) hours as needed for nausea or vomiting.   promethazine (PHENERGAN) 25 MG tablet Take 25 mg by mouth every 6 (six) hours as needed for nausea or vomiting.   rosuvastatin (CRESTOR) 10 MG tablet Take 1 tablet (10 mg total) by mouth daily.   Ubrogepant (UBRELVY) 100 MG TABS Take 100 mg by mouth daily as needed (migraine).   valACYclovir (VALTREX) 1000 MG tablet Take 1,000 mg by mouth every 8 (eight) hours as needed (cold sores).   venlafaxine XR (EFFEXOR-XR) 150 MG 24 hr capsule Take 150 mg by mouth daily.     Allergies:   Hydrocodone, Peanut oil, Hydrocodone, and Neosporin [bacitracin-polymyxin  b]   Social History   Socioeconomic History   Marital status: Married    Spouse name: Not on file   Number of children: Not on file   Years of education: Not on file   Highest education level: Not on file  Occupational History   Not on file  Tobacco Use   Smoking status: Never   Smokeless tobacco: Never  Vaping Use   Vaping Use: Never used  Substance and Sexual Activity   Alcohol use: Not Currently   Drug use: Not Currently   Sexual activity: Not on file  Other Topics Concern   Not on file  Social History Narrative   ** Merged History Encounter **       Social Determinants of Health   Financial Resource Strain: Not on file  Food Insecurity: Not on file   Transportation Needs: Not on file  Physical Activity: Not on file  Stress: Not on file  Social Connections: Not on file     Family History: The patient's family history includes Heart attack in her maternal grandfather, paternal grandfather, and paternal grandmother.  ROS:   Please see the history of present illness.     All other systems reviewed and are negative.  EKGs/Labs/Other Studies Reviewed:    The following studies were reviewed today:   EKG:  EKG is  ordered today.  The ekg ordered today demonstrates   08/14/2022- NSR, low voltage/poor r wave progression. Likely related to habitus  Recent Labs: 08/05/2022: TSH 1.124 08/10/2022: ALT 20; BUN 7; Creatinine, Ser 0.93; Hemoglobin 12.4; Platelets 282; Potassium 3.7; Sodium 138   Recent Lipid Panel    Component Value Date/Time   CHOL 182 07/12/2022 0326   TRIG 163 (H) 07/12/2022 0326   HDL 39 (L) 07/12/2022 0326   CHOLHDL 4.7 07/12/2022 0326   VLDL 33 07/12/2022 0326   LDLCALC 110 (H) 07/12/2022 0326     Risk Assessment/Calculations:    Physical Exam:    VS:   Vitals:   08/14/22 1314  BP: 117/81  Pulse: 79  SpO2: 96%     Wt Readings from Last 3 Encounters:  08/14/22 185 lb 9.6 oz (84.2 kg)  08/10/22 180 lb (81.6 kg)  08/07/22 180 lb (81.6 kg)     GEN:  Well nourished, well developed in no acute distress HEENT: Normal NECK: No JVD;  LYMPHATICS: No lymphadenopathy CARDIAC: RRR, no murmurs, rubs, gallops RESPIRATORY:  Clear to auscultation without rales, wheezing or rhonchi  ABDOMEN: Soft, non-tender, non-distended MUSCULOSKELETAL:  No edema; No deformity  SKIN: Warm and dry NEUROLOGIC:  Alert and oriented x 3 PSYCHIATRIC:  Normal affect   ASSESSMENT:    Non cardiac CP: She has had a complete work up with no evidence of cardiac disease. Discussed this with her. PLAN:    In order of problems listed above:  No further cardiac w/u          Medication Adjustments/Labs and Tests  Ordered: Current medicines are reviewed at length with the patient today.  Concerns regarding medicines are outlined above.  Orders Placed This Encounter  Procedures   EKG 12-Lead   No orders of the defined types were placed in this encounter.   Patient Instructions  Medication Instructions:  Your physician recommends that you continue on your current medications as directed. Please refer to the Current Medication list given to you today.  *If you need a refill on your cardiac medications before your next appointment, please call your pharmacy*  Follow-Up: At [facility], you and your health needs are our priority.  As part of our continuing mission to provide you with exceptional heart care, we have created designated Provider Care Teams.  These Care Teams include your primary Cardiologist (physician) and Advanced Practice Providers (APPs -  Physician Assistants and Nurse Practitioners) who all work together to provide you with the care you need, when you need it.  We recommend signing up for the patient portal called "MyChart".  Sign up information is provided on this After Visit Summary.  MyChart is used to connect with patients for Virtual Visits (Telemedicine).  Patients are able to view lab/test results, encounter notes, upcoming appointments, etc.  Non-urgent messages can be sent to your provider as well.   To learn more about what you can do with MyChart, go to [email or web address].    Your next appointment:   We will see you on an as needed basis.  Provider:   Carolan Clines, MD   Signed, Maisie Fus, MD  08/14/2022 1:43 PM    [facility]

## 2022-08-16 ENCOUNTER — Encounter: Payer: Self-pay | Admitting: Neurology

## 2022-08-16 ENCOUNTER — Ambulatory Visit (INDEPENDENT_AMBULATORY_CARE_PROVIDER_SITE_OTHER): Payer: Medicare Other | Admitting: Neurology

## 2022-08-16 VITALS — BP 117/80 | HR 74 | Ht 59.0 in | Wt 186.0 lb

## 2022-08-16 DIAGNOSIS — I6381 Other cerebral infarction due to occlusion or stenosis of small artery: Secondary | ICD-10-CM | POA: Diagnosis not present

## 2022-08-16 NOTE — Progress Notes (Signed)
[facility]  PATIENT: Brianna [name] DOB: 06/21/1981  REQUESTING CLINICIAN: No ref. provider found HISTORY FROM: Patient  REASON FOR VISIT: Seizure and headaches management    HISTORICAL  CHIEF COMPLAINT:  Chief Complaint  Patient presents with   Follow-up    ROOM 12,  Here to discuss mri results   INTERVAL HISTORY 08/16/22:  Patient presents today for follow-up, she is accompanied by her husband.  Since last visit in June, she had multiple ED visits due to chest pain and strokelike symptoms.  She had extensive work-up which were unrevealing from a cardiac standpoint. She presented on September 2 for strokelike symptoms, described as numbness asymmetric smile, weakness and again the symptoms are not specific to one side, she can have weakness of the right side and numbness on the left side, she is also complaining of biting her tongue when eating. At that time, she had a MRI brain which show a old right basal ganglia stroke.  She is already on atorvastatin and losartan.  She is not taking daily aspirin. In terms of her migraines, they are well controlled, denies any recent migraine attack In terms of her seizures or seizure-like activity, she denies any new events.   INTERVAL HISTORY 05/29/22 Patient presents for follow-up, she is accompanied by her husband.  Since last visit in April she denies any additional seizures.  She self discontinued the Depakote, initially had headaches but now the headache frequency are better.  She is requesting a DMV paperwork to be filled. Her ambulatory EEG was negative.    INTERVAL HISTORY 03/20/2022:  Patient presents today for follow-up, since last visit in November she has been doing well in terms of her migraines.  She started the Aimovig and reported yesterday was the first days that she took [place] for an acute migraine treatment.  Otherwise she has been doing well.  In terms of the seizures, she is on Depakote 500 mg twice  daily, her last seizure were more than 5 years and she is interested in coming off medication.  Her last Depakote level was normal at 68.  Denies any seizures since last visit.  Denies any side effect from the medication.   HISTORY OF PRESENT ILLNESS:  This is a 41 year old woman with past medical history of seizure disorder, migraine headaches, hypothyroidism, asthma and chronic bronchitis who is presenting from management of her migraine headache and seizure disorder.  In terms of her migraines, started in her early 34s, stated pain is usually on the right side of the head described as throbbing pain 10 out of 10 and can travel to the left.  When she gets the pain it last the entire day and even sometimes linger the next morning.  For her migraine she tried numerous preventive medications including Topamax, amitriptyline, she is currently on Depakote for seizures, Lexapro and gabapentin.  For abortive medication patient reports trying over-the-counter medication, she was previously on triptan without much relief and currently she is on [place] which seem to help.  Migraines are associated with photophobia and phonophobia and nausea but no vomiting.     Headache History and Characteristics: Onset: early 68s  Location: Right side of head, can travel to the left  Quality:  Throbbing  Intensity: 10/10.  Duration: last the entire day up to the next morning  Migrainous Features: Photophobia, phonophobia, nausea.  Aura: No  History of brain injury or tumor: No  Family history: No  Motion sickness: no Cardiac history: no  OTC: tylenol, codeine  Prior prophylaxis: Propranolol: No  Verapamil:No TCA: Yes  Topamax: Yes Depakote: Yes for seizure Effexor: No Cymbalta: No Neurontin:Yes  Lexapro: Yes   Prior abortives: Triptan: Yes  Anti-emetic: Yes, Zofran  Steroids: No Ergotamine suppository: No Ubrelvy: Yes    In terms of her seizures, she was diagnosed around 2013-2014, described the  seizure as staring spells.  Her last seizure was more than 5 years ago, she is currently well-managed on Depakote 1000 mg nightly but reports difficulty taking the pills, described the pill as "large horse pill" that she cannot swallow and would like to try something different, no other side effects from the medications.  She has previously tried levetiracetam, lacosamide, lamotrigine, topiramate.   OTHER MEDICAL CONDITIONS: Seizure, migraines, hypothyroidism, asthma, chronic bronchitis,    REVIEW OF SYSTEMS: Full 14 system review of systems performed and negative with exception of: as noted in the HPI  ALLERGIES: Allergies  Allergen Reactions   Hydrocodone Nausea And Vomiting and Other (See Comments)   Peanut Oil Itching   Hydrocodone Other (See Comments)    Projectile vomiting   Neosporin [Bacitracin-Polymyxin B] Swelling    HOME MEDICATIONS: Outpatient Medications Prior to Visit  Medication Sig Dispense Refill   amitriptyline (ELAVIL) 25 MG tablet Take 25 mg by mouth daily.     cephALEXin (KEFLEX) 500 MG capsule Take 1 capsule (500 mg total) by mouth 4 (four) times daily. 20 capsule 0   Erenumab-aooe (AIMOVIG) 140 MG/ML SOAJ INJECT 140 MG INTO THE SKIN EVERY 30 DAYS (Patient taking differently: Inject 140 mg into the skin every 30 (thirty) days.) 1 mL 3   escitalopram (LEXAPRO) 20 MG tablet Take 20 mg by mouth daily.     ferrous sulfate 325 (65 FE) MG tablet Take 325 mg by mouth daily with breakfast.     gabapentin (NEURONTIN) 100 MG capsule Take 100 mg by mouth at bedtime.     HUMIRA PEN-CD/UC/HS STARTER 80 MG/0.8ML PNKT Inject 0.8 mLs into the skin every 14 (fourteen) days.     hydrOXYzine (ATARAX) 10 MG tablet Take 10 mg by mouth every 8 (eight) hours as needed for itching or anxiety.     ibuprofen (ADVIL) 800 MG tablet Take 800 mg by mouth at bedtime.     levothyroxine (SYNTHROID) 25 MCG tablet Take 25 mcg by mouth at bedtime.     lisinopril (ZESTRIL) 5 MG tablet Take 5 mg by  mouth daily.     losartan (COZAAR) 50 MG tablet Take 50 mg by mouth daily.     ondansetron (ZOFRAN) 4 MG tablet Take 4 mg by mouth every 8 (eight) hours as needed for nausea or vomiting.     promethazine (PHENERGAN) 25 MG tablet Take 25 mg by mouth every 6 (six) hours as needed for nausea or vomiting.     rosuvastatin (CRESTOR) 10 MG tablet Take 1 tablet (10 mg total) by mouth daily. 90 tablet 0   sulfamethoxazole-trimethoprim (BACTRIM DS) 800-160 MG tablet SMARTSIG:1 Tablet(s) By Mouth Every 12 Hours     Ubrogepant (UBRELVY) 100 MG TABS Take 100 mg by mouth daily as needed (migraine).     valACYclovir (VALTREX) 1000 MG tablet Take 1,000 mg by mouth every 8 (eight) hours as needed (cold sores).     venlafaxine XR (EFFEXOR-XR) 150 MG 24 hr capsule Take 150 mg by mouth daily.     No facility-administered medications prior to visit.    PAST MEDICAL HISTORY: Past Medical History:  Diagnosis Date  Bipolar affective (HCC)    Hypertension    Hypothyroidism    Seizure disorder (HCC)     PAST SURGICAL HISTORY: Past Surgical History:  Procedure Laterality Date   ABDOMINAL HYSTERECTOMY     CHOLECYSTECTOMY     COLONOSCOPY WITH PROPOFOL N/A 10/05/2021   Procedure: COLONOSCOPY WITH PROPOFOL;  Surgeon: Wyline MoodAnna, Kiran, MD;  [facility];  Service: Gastroenterology;  Laterality: N/A;   NEPHRECTOMY Left     FAMILY HISTORY: Family History  Problem Relation Age of Onset   Heart attack Maternal Grandfather        Deceased   Heart attack Paternal Grandmother    Heart attack Paternal Grandfather     SOCIAL HISTORY: Social History   Socioeconomic History   Marital status: Married    Spouse name: Not on file   Number of children: Not on file   Years of education: Not on file   Highest education level: Not on file  Occupational History   Not on file  Tobacco Use   Smoking status: Never   Smokeless tobacco: Never  Vaping Use   Vaping Use: Never used  Substance and Sexual  Activity   Alcohol use: Not Currently   Drug use: Not Currently   Sexual activity: Not on file  Other Topics Concern   Not on file  Social History Narrative   ** Merged History Encounter **       Social Determinants of Health   Financial Resource Strain: Not on file  Food Insecurity: Not on file  Transportation Needs: Not on file  Physical Activity: Not on file  Stress: Not on file  Social Connections: Not on file  Intimate Partner Violence: Not on file     PHYSICAL EXAM  GENERAL EXAM/CONSTITUTIONAL: Vitals:  Vitals:   08/16/22 1533  BP: 117/80  Pulse: 74  Weight: 186 lb (84.4 kg)  Height: 4\' 11"  (1.499 m)   Body mass index is 37.57 kg/m. Wt Readings from Last 3 Encounters:  08/16/22 186 lb (84.4 kg)  08/14/22 185 lb 9.6 oz (84.2 kg)  08/10/22 180 lb (81.6 kg)   Patient is in no distress; well developed, nourished and groomed; neck is supple  EYES: Pupils round and reactive to light, Visual fields full to confrontation, Extraocular movements intacts,   MUSCULOSKELETAL: Gait, strength, tone, movements noted in Neurologic exam below  NEUROLOGIC: MENTAL STATUS:      No data to display         awake, alert, oriented to person, place and time recent and remote memory intact normal attention and concentration language fluent, comprehension intact, naming intact fund of knowledge appropriate  CRANIAL NERVE:  2nd, 3rd, 4th, 6th - pupils equal and reactive to light, visual fields full to confrontation, extraocular muscles intact, no nystagmus 5th - facial sensation symmetric 7th - facial strength symmetric 8th - hearing intact 9th - palate elevates symmetrically, uvula midline 11th - shoulder shrug symmetric 12th - tongue protrusion midline  MOTOR:  normal bulk and tone, full strength in the BUE, BLE  SENSORY:  normal and symmetric to light touch, pinprick, temperature, vibration  COORDINATION:  finger-nose-finger, fine finger movements  normal  REFLEXES:  deep tendon reflexes present and symmetric  GAIT/STATION:  normal   DIAGNOSTIC DATA (LABS, IMAGING, TESTING) - I reviewed patient records, labs, notes, testing and imaging myself where available.  Lab Results  Component Value Date   WBC 7.9 08/10/2022   HGB 12.4 08/10/2022   HCT 38.2 08/10/2022   MCV  91.4 08/10/2022   PLT 282 08/10/2022      Component Value Date/Time   NA 138 08/10/2022 1144   NA 142 10/17/2021 1601   K 3.7 08/10/2022 1144   CL 108 08/10/2022 1144   CO2 24 08/10/2022 1144   GLUCOSE 111 (H) 08/10/2022 1144   BUN 7 08/10/2022 1144   BUN 12 10/17/2021 1601   CREATININE 0.93 08/10/2022 1144   CALCIUM 8.7 (L) 08/10/2022 1144   PROT 6.2 (L) 08/10/2022 1144   PROT 7.3 10/17/2021 1601   ALBUMIN 3.6 08/10/2022 1144   ALBUMIN 4.4 10/17/2021 1601   AST 17 08/10/2022 1144   ALT 20 08/10/2022 1144   ALKPHOS 66 08/10/2022 1144   BILITOT 0.4 08/10/2022 1144   BILITOT <0.2 10/17/2021 1601   GFRNONAA >60 08/10/2022 1144   Lab Results  Component Value Date   CHOL 182 07/12/2022   HDL 39 (L) 07/12/2022   LDLCALC 110 (H) 07/12/2022   TRIG 163 (H) 07/12/2022   CHOLHDL 4.7 07/12/2022   No results found for: "HGBA1C" No results found for: "VITAMINB12" Lab Results  Component Value Date   TSH 1.124 08/05/2022   Depakote level 10/17/2021: 68   MRI Brain 02/22/2021 1.  No acute intracranial abnormality or findings to suggest a potential candidate seizure focus.  2.  Few scattered T2/FLAIR hyperintensities in the subcortical white matter are nonspecific, but likely reflect foci of gliosis from prior insults.   MRI Brain/MRA Head and Neck 08/05/22 1. No acute intracranial abnormality. 2. 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 3. Motion degraded head and neck MRAs. No large vessel occlusion. Limited assessment for stenosis.     EEG 04/26/2018 This is a mildly abnormal EEG due to the presence of occasional right temporal sharp  wave discharges with some mild slowing noted.  This may suggest an underlying area of cortical irritability, and correlation with neuroimaging may be helpful.  In addition, if seizures are apparent, this would suggest a possible focal onset for her seizures.  Ambulatory EEG: Normal, no seizure, no events captured.   ASSESSMENT AND PLAN  41 y.o. year old female with past medical history of migraine headaches, seizure disorder, asthma, chronic bronchitis who is presenting for follow up after recent MRI brain showed chronic stroke in the right basal ganglia.  Stroke etiology likely small vessel disease.  She has a history of hypertension and hyperlipidemia, already on Rosuvastatin and losartan.  Advised patient to continue current medications and I will add also aspirin 81 mg daily.  Discussed stroke symptoms that we will require her to go to the hospital otherwise advised her to contact her primary care doctor and contact us if she is having some occasional numbness or tingling. She voices understanding.     1. Stroke of right basal ganglia (HCC)      There are no Patient Instructions on file for this visit.   No orders of the defined types were placed in this encounter.   No orders of the defined types were placed in this encounter.   Return in about 6 months (around 02/14/2023).   I have spent a total of 30 minutes dedicated to this patient today, preparing to see patient, performing a medically appropriate examination and evaluation, ordering tests and/or medications and procedures, and counseling and educating the patient/family/caregiver; independently interpreting result and communicating results to the family/patient/caregiver; and documenting clinical information in the electronic medical record.   Windell Norfolk, MD 08/16/2022, 5:23 PM  [facility]  [address], [address] [place], [place] [address] [phone]

## 2022-08-16 NOTE — Patient Instructions (Signed)
Start with aspirin 81 mg daily Continue with your other medications Continue to follow with PCP Return in 6 months or sooner if worse.

## 2022-08-23 ENCOUNTER — Emergency Department
Admission: EM | Admit: 2022-08-23 | Discharge: 2022-08-23 | Disposition: A | Discharge status: Home / Self Care | Payer: Medicare Other | Attending: Emergency Medicine | Admitting: Emergency Medicine

## 2022-08-23 ENCOUNTER — Other Ambulatory Visit: Payer: Self-pay

## 2022-08-23 DIAGNOSIS — R220 Localized swelling, mass and lump, head: Secondary | ICD-10-CM

## 2022-08-23 DIAGNOSIS — J45909 Unspecified asthma, uncomplicated: Secondary | ICD-10-CM | POA: Diagnosis not present

## 2022-08-23 DIAGNOSIS — I10 Essential (primary) hypertension: Secondary | ICD-10-CM | POA: Insufficient documentation

## 2022-08-23 DIAGNOSIS — K148 Other diseases of tongue: Secondary | ICD-10-CM | POA: Insufficient documentation

## 2022-08-23 DIAGNOSIS — Z79899 Other long term (current) drug therapy: Secondary | ICD-10-CM | POA: Diagnosis not present

## 2022-08-23 MED ORDER — PREDNISONE 50 MG PO TABS
50.0000 mg | ORAL_TABLET | Freq: Every day | ORAL | 0 refills | Status: AC
Start: 2022-08-23 — End: 2022-08-26

## 2022-08-23 MED ORDER — DIPHENHYDRAMINE HCL 50 MG/ML IJ SOLN
50.0000 mg | Freq: Once | INTRAMUSCULAR | Status: AC
  Administered 2022-08-23: 50 mg via INTRAMUSCULAR
  Filled 2022-08-23: qty 1

## 2022-08-23 MED ORDER — DIPHENHYDRAMINE HCL 50 MG/ML IJ SOLN: 50.0000 mg | Freq: Once | INTRAMUSCULAR | Status: DC

## 2022-08-23 MED ORDER — DEXAMETHASONE SODIUM PHOSPHATE 10 MG/ML IJ SOLN
10.0000 mg | Freq: Once | INTRAMUSCULAR | Status: AC
  Administered 2022-08-23: 10 mg via INTRAMUSCULAR
  Filled 2022-08-23: qty 1

## 2022-08-23 NOTE — ED Triage Notes (Signed)
First Nurse Note:  Arrives with c/o tongue swelling since last night.    Patient is AAOx3.  Skin warm and dry.  Speech clear.  Voice strong.  NAD

## 2022-08-23 NOTE — ED Notes (Signed)
Pt A&OX4 ambulatory at d/c with independent steady gait. Pt verbalized understanding of d/c instructions, prescription and follow up care. 

## 2022-08-23 NOTE — ED Provider Notes (Signed)
[facility] Provider Note    Event Date/Time   First MD Initiated Contact with Patient 08/23/22 1238     (approximate)   History   Chief Complaint Oral Swelling   HPI Brianna [name] is a 41 y.o. female, history of bipolar affective disorder, hypertension, hyperlipidemia, asthma, IBS, migraines, GAD, presents to the emergency department for evaluation of tongue swelling.  She states that it started last night.  No known triggers.  Over the night, she felt like she was biting her tongue more often.  When she called her doctor, her doctor noticed that she sounded different from before.  She does not have any difficulty eating or drinking.  No shortness of breath.  She notices a numbness/tingling sensation around her mouth as well.  She does take lisinopril, though she has been on this for years.  Denies fever/chills, shortness of breath, chest pain, abdominal pain, throat pain, dysphagia, nausea/vomiting, diarrhea, rash/lesions, or dizziness/lightheadedness.  History Limitations: No limitations.        Physical Exam  Triage Vital Signs: ED Triage Vitals  Enc Vitals Group     BP 08/23/22 1200 120/76     Pulse Rate 08/23/22 1200 88     Resp 08/23/22 1200 18     Temp --      Temp src --      SpO2 08/23/22 1200 100 %     Weight 08/23/22 1201 186 lb 1.1 oz (84.4 kg)     Height 08/23/22 1201 4\' 11"  (1.499 m)     Head Circumference --      Peak Flow --      Pain Score 08/23/22 1201 7     Pain Loc --      Pain Edu? --      Excl. in [place]? --     Most recent vital signs: Vitals:   08/23/22 1200  BP: 120/76  Pulse: 88  Resp: 18  SpO2: 100%    General: Awake, NAD.  Skin: Warm, dry. No rashes or lesions.  Eyes: PERRL. Conjunctivae normal.  CV: Good peripheral perfusion.  Resp: Normal effort.  Lung sounds are clear bilaterally in the apices and bases. Abd: Soft, non-tender. No distention.  Neuro: At baseline. No gross neurological deficits.   Musculoskeletal: Normal ROM of all extremities.   Focused Exam: Mild amount of tongue swelling.  No oropharyngeal edema.  No tonsillar swelling or exudates.  No erythema or swelling along the gingiva.  Normal dentition.  No active bleeding or discharge.  No lip swelling at this time.  Physical Exam    ED Results / Procedures / Treatments  Labs (all labs ordered are listed, but only abnormal results are displayed) Labs Reviewed - No data to display   EKG N/A.   RADIOLOGY  ED Provider Interpretation: N/A.  No results found.  PROCEDURES:  Critical Care performed: N/A.  Procedures    MEDICATIONS ORDERED IN ED: Medications  dexamethasone (DECADRON) injection 10 mg (10 mg Intramuscular Given 08/23/22 1340)  diphenhydrAMINE (BENADRYL) injection 50 mg (50 mg Intramuscular Given 08/23/22 1340)     IMPRESSION / MDM / ASSESSMENT AND PLAN / ED COURSE  I reviewed the triage vital signs and the nursing notes.                              Differential diagnosis includes, but is not limited to, angioedema, adverse reaction lisinopril reaction, allergic reaction, anaphylaxis.  Assessment/Plan Patient presents with mild tongue swelling that started last night.  Physical exam is unimpressive.  Lung sounds are clear bilaterally.  No difficulty with eating/drinking.  No shortness of breath.  No oropharyngeal edema.  This does not appear to be anaphylaxis.  It is possible that it could be related to the lisinopril, though she has been on this for several years.  Treated here in the emergency department with IM Benadryl and dexamethasone.  On reexamination approximately 1 hour after, swelling has reduced significantly.  I believe she is safe for discharge.  The patient agrees.  We will provide her with a short-term prescription for steroids to take if needed.  Encouraged her to take Benadryl as needed as well.  Recommend that she follow-up with her primary care provider and explore  alternative antihypertensive agents.  Will discharge.  Provided the patient with anticipatory guidance, return precautions, and educational material. Encouraged the patient to return to the emergency department at any time if they begin to experience any new or worsening symptoms. Patient expressed understanding and agreed with the plan.   Patient's presentation is most consistent with acute, uncomplicated illness.       FINAL CLINICAL IMPRESSION(S) / ED DIAGNOSES   Final diagnoses:  Tongue swelling     Rx / DC Orders   ED Discharge Orders          Ordered    predniSONE (DELTASONE) 50 MG tablet  Daily with breakfast        08/23/22 1450             Note:  This document was prepared using Dragon voice recognition software and may include unintentional dictation errors.   Teodoro Spray, [place] 08/23/22 1455    Duffy Bruce, MD 08/27/22 [phone]

## 2022-08-23 NOTE — ED Triage Notes (Addendum)
Pt here with oral swelling that started last night. Pt states she began feeling pain in her tongue and swelling more this morning. Pt airway intact, no distress. Pt takes lisinopril.

## 2022-08-23 NOTE — Discharge Instructions (Addendum)
-  If your tongue swelling completely resolves, you do not take any further medications.  If you continue to have any swelling tomorrow, you may take the prescribed prednisone.  You may additionally take Benadryl as needed.  -Please follow-up with your primary care provider to review your medications for blood pressure.  Consider an alternate to lisinopril, which may have been the cause of the tongue swelling.  -Return to the emergency department at any time if you begin to experience any new or worsening symptoms.

## 2022-08-24 ENCOUNTER — Emergency Department: Payer: Medicare Other

## 2022-08-24 ENCOUNTER — Other Ambulatory Visit: Payer: Self-pay

## 2022-08-24 ENCOUNTER — Encounter: Payer: Self-pay | Admitting: Emergency Medicine

## 2022-08-24 ENCOUNTER — Emergency Department
Admission: EM | Admit: 2022-08-24 | Discharge: 2022-08-24 | Disposition: A | Discharge status: Home / Self Care | Payer: Medicare Other | Attending: Emergency Medicine | Admitting: Emergency Medicine

## 2022-08-24 DIAGNOSIS — J45909 Unspecified asthma, uncomplicated: Secondary | ICD-10-CM | POA: Insufficient documentation

## 2022-08-24 DIAGNOSIS — I10 Essential (primary) hypertension: Secondary | ICD-10-CM | POA: Insufficient documentation

## 2022-08-24 DIAGNOSIS — K2100 Gastro-esophageal reflux disease with esophagitis, without bleeding: Secondary | ICD-10-CM | POA: Insufficient documentation

## 2022-08-24 DIAGNOSIS — R079 Chest pain, unspecified: Secondary | ICD-10-CM | POA: Diagnosis present

## 2022-08-24 DIAGNOSIS — R0789 Other chest pain: Secondary | ICD-10-CM

## 2022-08-24 LAB — CBC
HCT: 42.4 % (ref 36.0–46.0)
Hemoglobin: 13.7 g/dL (ref 12.0–15.0)
MCH: 28.8 pg (ref 26.0–34.0)
MCHC: 32.3 g/dL (ref 30.0–36.0)
MCV: 89.3 fL (ref 80.0–100.0)
Platelets: 408 10*3/uL — ABNORMAL HIGH (ref 150–400)
RBC: 4.75 MIL/uL (ref 3.87–5.11)
RDW: 12.3 % (ref 11.5–15.5)
WBC: 18.2 10*3/uL — ABNORMAL HIGH (ref 4.0–10.5)
nRBC: 0 % (ref 0.0–0.2)

## 2022-08-24 LAB — BASIC METABOLIC PANEL
Anion gap: 8 (ref 5–15)
BUN: 14 mg/dL (ref 6–20)
CO2: 22 mmol/L (ref 22–32)
Calcium: 10.3 mg/dL (ref 8.9–10.3)
Chloride: 105 mmol/L (ref 98–111)
Creatinine, Ser: 1.11 mg/dL — ABNORMAL HIGH (ref 0.44–1.00)
GFR, Estimated: >60 mL/min (ref >60)
Glucose, Bld: 128 mg/dL — ABNORMAL HIGH (ref 70–99)
Potassium: 4.4 mmol/L (ref 3.5–5.1)
Sodium: 135 mmol/L (ref 135–145)

## 2022-08-24 LAB — TROPONIN I (HIGH SENSITIVITY)
Troponin I (High Sensitivity): <2 ng/L (ref <18)
Troponin I (High Sensitivity): <2 ng/L (ref <18)

## 2022-08-24 MED ORDER — LIDOCAINE VISCOUS HCL 2 % MT SOLN
15.0000 mL | Freq: Once | OROMUCOSAL | Status: AC
  Administered 2022-08-24: 15 mL via ORAL
  Filled 2022-08-24: qty 15

## 2022-08-24 MED ORDER — ALUM & MAG HYDROXIDE-SIMETH 200-200-20 MG/5ML PO SUSP
30.0000 mL | Freq: Once | ORAL | Status: AC
  Administered 2022-08-24: 30 mL via ORAL
  Filled 2022-08-24: qty 30

## 2022-08-24 NOTE — ED Triage Notes (Signed)
Pt to ER with c/o chest pain that started last night as indigestion.  States this am she is feeling worse and that the pain is a stabbing pain. Pt reports feeling weak, reports nausea, mild SHOB.  Pt has hx of same pain, no diagnosis has been made at this time.

## 2022-08-24 NOTE — ED Provider Notes (Signed)
[facility] Provider Note    Event Date/Time   First MD Initiated Contact with Patient 08/24/22 1135     (approximate)   History   Chief Complaint Chest Pain   HPI  Brianna [name] is a 41 y.o. female with past medical history of hypertension, hyperlipidemia, asthma, migraines, seizures, bipolar disorder, and generalized anxiety disorder who presents to the ED complaining of chest pain.  Patient reports that she initially developed "heartburn" last night while she was trying to go to sleep.  She states that she took 1 dose of pantoprazole as well as 3 Tums without significant relief, states that she does not take pantoprazole regularly.  Pain seems to slightly improve until she got up this morning, when she reported increasing sharp stabbing pain in the center of her chest.  This is been associated with a nonproductive cough, she denies any fevers or difficulty breathing.  She has not noticed any pain or swelling in her legs.  She reports similar episodes of chest pain in the past, however describes pain as more severe and stabbing today.     Physical Exam   Triage Vital Signs: ED Triage Vitals  Enc Vitals Group     BP 08/24/22 0952 109/69     Pulse Rate 08/24/22 0952 (!) 106     Resp 08/24/22 0952 20     Temp 08/24/22 0952 97.7 F (36.5 C)     Temp Source 08/24/22 0952 Oral     SpO2 08/24/22 0952 94 %     Weight 08/24/22 0952 181 lb (82.1 kg)     Height 08/24/22 0952 4\' 11"  (1.499 m)     Head Circumference --      Peak Flow --      Pain Score 08/24/22 0956 9     Pain Loc --      Pain Edu? --      Excl. in GC? --     Most recent vital signs: Vitals:   08/24/22 0952 08/24/22 1418  BP: 109/69 105/73  Pulse: (!) 106 87  Resp: 20 16  Temp: 97.7 F (36.5 C) 98.2 F (36.8 C)  SpO2: 94% 93%    Constitutional: Alert and oriented. Eyes: Conjunctivae are normal. Head: Atraumatic. Nose: No congestion/rhinnorhea. Mouth/Throat: Mucous membranes  are moist.  Cardiovascular: Normal rate, regular rhythm. Grossly normal heart sounds.  2+ radial pulses bilaterally. Respiratory: Normal respiratory effort.  No retractions. Lungs CTAB.  No chest wall tenderness to palpation. Gastrointestinal: Soft and nontender. No distention. Musculoskeletal: No lower extremity tenderness nor edema.  Neurologic:  Normal speech and language. No gross focal neurologic deficits are appreciated.    ED Results / Procedures / Treatments   Labs (all labs ordered are listed, but only abnormal results are displayed) Labs Reviewed  BASIC METABOLIC PANEL - Abnormal; Notable for the following components:      Result Value   Glucose, Bld 128 (*)    Creatinine, Ser 1.11 (*)    All other components within normal limits  CBC - Abnormal; Notable for the following components:   WBC 18.2 (*)    Platelets 408 (*)    All other components within normal limits  TROPONIN I (HIGH SENSITIVITY)  TROPONIN I (HIGH SENSITIVITY)     EKG  ED ECG REPORT I, Chesley Noon, the attending physician, personally viewed and interpreted this ECG.   Date: 08/24/2022  EKG Time: 9:48  Rate: 106  Rhythm: sinus tachycardia  Axis: Normal  Intervals:left posterior fascicular block  ST&T Change: None  RADIOLOGY Chest x-ray reviewed and interpreted by me with no infiltrate, edema, or effusion.  PROCEDURES:  Critical Care performed: No  Procedures   MEDICATIONS ORDERED IN ED: Medications  alum & mag hydroxide-simeth (MAALOX/MYLANTA) 200-200-20 MG/5ML suspension 30 mL (30 mLs Oral Given 08/24/22 1237)    And  lidocaine (XYLOCAINE) 2 % viscous mouth solution 15 mL (15 mLs Oral Given 08/24/22 1237)     IMPRESSION / MDM / ASSESSMENT AND PLAN / ED COURSE  I reviewed the triage vital signs and the nursing notes.                              41 y.o. female with past medical history of hypertension, hyperlipidemia, asthma, migraines, seizures, bipolar disorder, and generalized  anxiety disorder who presents to the ED complaining of heartburn last night with increasing sharp and stabbing pain in the center of her chest this morning.  Patient's presentation is most consistent with acute presentation with potential threat to life or bodily function.  Differential diagnosis includes, but is not limited to, ACS, PE, pneumonia, pneumothorax, dissection, GERD, musculoskeletal pain, arrhythmia, anxiety.  Patient nontoxic-appearing and in no acute distress, vital signs remarkable for mild tachycardia but otherwise reassuring.  EKG shows no evidence of arrhythmia or ischemia and initial troponin within normal limits, low suspicion for ACS given her atypical symptoms.  I also reviewed her prior cardiology visit and she had negative coronary CTA within the past couple of months.  She also had CTA of her chest earlier this month that was negative for PE and I have low suspicion for PE at this time.  We will observe on cardiac monitor, repeat troponin, and treat with GI cocktail given her description of heartburn.  Repeat troponin within normal limits, no events noted on cardiac monitor.  Patient reports improvement in pain following GI cocktail and she is appropriate for discharge home with PCP follow-up, suspect chest pain is due to GERD.  She was counseled to take her Protonix daily and may use Tums or Pepcid as needed.  She was counseled to return to the ED for new or worsening symptoms, patient agrees with plan.      FINAL CLINICAL IMPRESSION(S) / ED DIAGNOSES   Final diagnoses:  Atypical chest pain  Gastroesophageal reflux disease with esophagitis without hemorrhage     Rx / DC Orders   ED Discharge Orders     None        Note:  This document was prepared using Dragon voice recognition software and may include unintentional dictation errors.   Blake Divine, MD 08/24/22 [phone]

## 2022-09-06 ENCOUNTER — Institutional Professional Consult (permissible substitution): Payer: Medicare Other | Admitting: Neurology

## 2022-09-11 ENCOUNTER — Emergency Department: Payer: Medicare Other

## 2022-09-11 ENCOUNTER — Other Ambulatory Visit: Payer: Self-pay

## 2022-09-11 ENCOUNTER — Telehealth: Payer: Self-pay | Admitting: Neurology

## 2022-09-11 ENCOUNTER — Encounter: Payer: Self-pay | Admitting: Intensive Care

## 2022-09-11 DIAGNOSIS — Y900 Blood alcohol level of less than 20 mg/100 ml: Secondary | ICD-10-CM | POA: Diagnosis not present

## 2022-09-11 DIAGNOSIS — J45909 Unspecified asthma, uncomplicated: Secondary | ICD-10-CM | POA: Insufficient documentation

## 2022-09-11 DIAGNOSIS — E039 Hypothyroidism, unspecified: Secondary | ICD-10-CM | POA: Diagnosis not present

## 2022-09-11 DIAGNOSIS — I10 Essential (primary) hypertension: Secondary | ICD-10-CM | POA: Insufficient documentation

## 2022-09-11 DIAGNOSIS — D649 Anemia, unspecified: Secondary | ICD-10-CM | POA: Diagnosis not present

## 2022-09-11 DIAGNOSIS — Z79899 Other long term (current) drug therapy: Secondary | ICD-10-CM | POA: Insufficient documentation

## 2022-09-11 DIAGNOSIS — I6381 Other cerebral infarction due to occlusion or stenosis of small artery: Secondary | ICD-10-CM | POA: Insufficient documentation

## 2022-09-11 DIAGNOSIS — R29810 Facial weakness: Secondary | ICD-10-CM | POA: Insufficient documentation

## 2022-09-11 LAB — COMPREHENSIVE METABOLIC PANEL
ALT: 16 U/L (ref 0–44)
AST: 14 U/L — ABNORMAL LOW (ref 15–41)
Albumin: 3.5 g/dL (ref 3.5–5.0)
Alkaline Phosphatase: 70 U/L (ref 38–126)
Anion gap: 2 — ABNORMAL LOW (ref 5–15)
BUN: 14 mg/dL (ref 6–20)
CO2: 27 mmol/L (ref 22–32)
Calcium: 8.6 mg/dL — ABNORMAL LOW (ref 8.9–10.3)
Chloride: 111 mmol/L (ref 98–111)
Creatinine, Ser: 0.85 mg/dL (ref 0.44–1.00)
GFR, Estimated: >60 mL/min (ref >60)
Glucose, Bld: 97 mg/dL (ref 70–99)
Potassium: 3.5 mmol/L (ref 3.5–5.1)
Sodium: 140 mmol/L (ref 135–145)
Total Bilirubin: 0.3 mg/dL (ref 0.3–1.2)
Total Protein: 6.3 g/dL — ABNORMAL LOW (ref 6.5–8.1)

## 2022-09-11 LAB — CBC
HCT: 35.5 % — ABNORMAL LOW (ref 36.0–46.0)
Hemoglobin: 11.7 g/dL — ABNORMAL LOW (ref 12.0–15.0)
MCH: 29.1 pg (ref 26.0–34.0)
MCHC: 33 g/dL (ref 30.0–36.0)
MCV: 88.3 fL (ref 80.0–100.0)
Platelets: 281 10*3/uL (ref 150–400)
RBC: 4.02 MIL/uL (ref 3.87–5.11)
RDW: 12.9 % (ref 11.5–15.5)
WBC: 6.8 10*3/uL (ref 4.0–10.5)
nRBC: 0 % (ref 0.0–0.2)

## 2022-09-11 LAB — DIFFERENTIAL
Abs Immature Granulocytes: 0.01 10*3/uL (ref 0.00–0.07)
Basophils Absolute: 0 10*3/uL (ref 0.0–0.1)
Basophils Relative: 0 %
Eosinophils Absolute: 0.2 10*3/uL (ref 0.0–0.5)
Eosinophils Relative: 3 %
Immature Granulocytes: 0 %
Lymphocytes Relative: 35 %
Lymphs Abs: 2.4 10*3/uL (ref 0.7–4.0)
Monocytes Absolute: 0.7 10*3/uL (ref 0.1–1.0)
Monocytes Relative: 10 %
Neutro Abs: 3.5 10*3/uL (ref 1.7–7.7)
Neutrophils Relative %: 52 %

## 2022-09-11 LAB — PROTIME-INR
INR: 1.2 (ref 0.8–1.2)
Prothrombin Time: 14.6 seconds (ref 11.4–15.2)

## 2022-09-11 LAB — ETHANOL: Alcohol, Ethyl (B): <10 mg/dL (ref <10)

## 2022-09-11 LAB — APTT: aPTT: 32 seconds (ref 24–36)

## 2022-09-11 NOTE — Telephone Encounter (Signed)
Pt is calling. Stated she is feeling weird on the right-side and mouth is to one-side. Pt is requesting a call back from nurse. Pt was advised to go to emergency room but Pt declined said she wants to talk to Dr. April Manson because the emergency room can never find anything wrong.

## 2022-09-11 NOTE — Telephone Encounter (Signed)
I called pt. No answer, left a message asking pt to call me back.   

## 2022-09-11 NOTE — ED Triage Notes (Signed)
Patient presents with right sided facial droop that her husband noticed at 7:30am when she woke up. Also reports right arm/hand weakness and numbness. Reports she does feel weak all over. Recently in the past few weeks c/o dropping things in her right hand. Speech clear.    Reports history stroke in the past that showed on brain scan but she is unsure when it occurred.

## 2022-09-11 NOTE — Telephone Encounter (Signed)
Thank you. Agree with plans to go to ED/Urgent care

## 2022-09-11 NOTE — ED Provider Triage Note (Signed)
  Emergency Medicine Provider Triage Evaluation Note  Brianna [name] , a 41 y.o.female,  was evaluated in triage.  Pt complains of right-sided facial droop and right side weakness/numbness.  She states that she is feeling her symptoms predominantly yesterday but is carried over into the morning.  She states that symptoms were particular bad at around around 0730 this morning when she woke up.  She states she does have a history of stroke in the past.   Review of Systems  Positive: Right-sided weakness, facial droop Negative: Denies fever, chest pain, vomiting  Physical Exam   Vitals:   09/11/22 1513  BP: 125/73  Pulse: 78  Resp: 16  Temp: 98.4 F (36.9 C)  SpO2: 97%   Gen:   Awake, no distress   Resp:  Normal effort  MSK:   Moves extremities without difficulty  Other:  No active facial droop or right-sided weakness at this time.  Subjective paresthesias in the right upper extremity.  Medical Decision Making  Given the patient's initial medical screening exam, the following diagnostic evaluation has been ordered. The patient will be placed in the appropriate treatment space, once one is available, to complete the evaluation and treatment. I have discussed the plan of care with the patient and I have advised the patient that an ED physician or mid-level practitioner will reevaluate their condition after the test results have been received, as the results may give them additional insight into the type of treatment they may need.    Diagnostics: Labs, EKG, head CT  Treatments: none immediately   Teodoro Spray, [place] 09/11/22 1553

## 2022-09-11 NOTE — Telephone Encounter (Signed)
Pt returned my call. She reports this morning when she woke up new onset of right eye droop, crooked smile on the right side, along with numbness tingling on the right side.  Pt wanted to know if she should go to the ER for evaluation due to the symptoms or be evaluated in our office. I advised due to the new onset of these symptoms she should proceed to the ER.  She verbalized understanding and agreement to plan.  Will send a message notifying MD as well.

## 2022-09-12 ENCOUNTER — Emergency Department
Admission: EM | Admit: 2022-09-12 | Discharge: 2022-09-12 | Disposition: A | Discharge status: Home / Self Care | Payer: Medicare Other | Attending: Emergency Medicine | Admitting: Emergency Medicine

## 2022-09-12 ENCOUNTER — Telehealth: Payer: Self-pay | Admitting: Neurology

## 2022-09-12 DIAGNOSIS — R29810 Facial weakness: Secondary | ICD-10-CM | POA: Diagnosis not present

## 2022-09-12 DIAGNOSIS — R2990 Unspecified symptoms and signs involving the nervous system: Secondary | ICD-10-CM

## 2022-09-12 HISTORY — DX: Migraine, unspecified, not intractable, without status migrainosus: G43.909

## 2022-09-12 HISTORY — DX: Pure hypercholesterolemia, unspecified: E78.00

## 2022-09-12 HISTORY — DX: Unspecified asthma, uncomplicated: J45.909

## 2022-09-12 HISTORY — DX: Bronchitis, not specified as acute or chronic: J40

## 2022-09-12 HISTORY — DX: Disorder of kidney and ureter, unspecified: N28.9

## 2022-09-12 NOTE — Discharge Instructions (Addendum)
Your lab work, CT head and MRI brain today were reassuring.  I suspect your symptoms could have been coming from a complex migraine headache but do recommend close follow-up with your neurologist given your history of seizures.  Please call in the morning to schedule outpatient follow-up.  If you have any return of your neurologic symptoms including numbness or weakness on one side of your body, vision changes, difficulty with speech, facial asymmetry, please return to the emergency department.

## 2022-09-12 NOTE — Telephone Encounter (Signed)
I called pt and we discussed ER eval from yesterday. She would like to schedule hsp f/u as recommended by the ER.. I have scheduled for 09/21/2022 with Dr. April Manson.

## 2022-09-12 NOTE — ED Notes (Signed)
Signing pad did not work, pt verbalized understanding of DC instructions. 

## 2022-09-12 NOTE — ED Provider Notes (Signed)
[facility] Provider Note    Event Date/Time   First MD Initiated Contact with Patient 09/12/22 0102     (approximate)   History   Facial Droop and Weakness   HPI  Brianna [name] is a 41 y.o. female with history of hypertension, hyperlipidemia, seizure disorder no longer on medications, bipolar disorder, migraine headaches who presents to the emergency department with concerns for possible stroke today.  States she woke up this morning with right-sided facial droop, numbness in the right arm followed by severe headache.  She states she has been told she has had a stroke before based on previous imaging but no residual neurologic symptoms.  No history of complex migraine.  States symptoms worsen throughout the day and ongoing for several hours.  No seizure-like activity.  States her seizures have never presented similarly.  Currently asymptomatic.   History provided by patient and husband.    Past Medical History:  Diagnosis Date   Asthma    Bipolar affective ([place])    Bronchitis    High cholesterol    Hypertension    Hypothyroidism    Migraines    Renal disorder    Seizure disorder [facility])     Past Surgical History:  Procedure Laterality Date   ABDOMINAL HYSTERECTOMY     CHOLECYSTECTOMY     COLONOSCOPY WITH PROPOFOL N/A 10/05/2021   Procedure: COLONOSCOPY WITH PROPOFOL;  Surgeon: Jonathon Bellows, MD;  Location: [facility] ENDOSCOPY;  Service: Gastroenterology;  Laterality: N/A;   NEPHRECTOMY Left     MEDICATIONS:  Prior to Admission medications   Medication Sig Start Date End Date Taking? Authorizing Provider  amitriptyline (ELAVIL) 25 MG tablet Take 25 mg by mouth daily. 07/07/22   [provider]  cephALEXin (KEFLEX) 500 MG capsule Take 1 capsule (500 mg total) by mouth 4 (four) times daily. 08/05/22   Regan Lemming, MD  Erenumab-aooe (AIMOVIG) 140 MG/ML SOAJ INJECT 140 MG INTO THE SKIN EVERY 30 DAYS Patient taking differently: Inject 140 mg into  the skin every 30 (thirty) days. 06/05/22   Alric Ran, MD  escitalopram (LEXAPRO) 20 MG tablet Take 20 mg by mouth daily.    [provider]  ferrous sulfate 325 (65 FE) MG tablet Take 325 mg by mouth daily with breakfast.    [provider]  gabapentin (NEURONTIN) 100 MG capsule Take 100 mg by mouth at bedtime.    [provider]  HUMIRA PEN-CD/UC/HS STARTER 80 MG/0.8ML PNKT Inject 0.8 mLs into the skin every 14 (fourteen) days. 02/10/22   [provider]  hydrOXYzine (ATARAX) 10 MG tablet Take 10 mg by mouth every 8 (eight) hours as needed for itching or anxiety. 07/18/22   [provider]  ibuprofen (ADVIL) 800 MG tablet Take 800 mg by mouth at bedtime.    [provider]  levothyroxine (SYNTHROID) 25 MCG tablet Take 25 mcg by mouth at bedtime.    [provider]  lisinopril (ZESTRIL) 5 MG tablet Take 5 mg by mouth daily.    [provider]  losartan (COZAAR) 50 MG tablet Take 50 mg by mouth daily. 06/20/22   [provider]  ondansetron (ZOFRAN) 4 MG tablet Take 4 mg by mouth every 8 (eight) hours as needed for nausea or vomiting. 07/18/22   [provider]  promethazine (PHENERGAN) 25 MG tablet Take 25 mg by mouth every 6 (six) hours as needed for nausea or vomiting. 07/29/22   [provider]  rosuvastatin (CRESTOR)  10 MG tablet Take 1 tablet (10 mg total) by mouth daily. 07/12/22   Arty Baumgartner, NP  sulfamethoxazole-trimethoprim (BACTRIM DS) 800-160 MG tablet SMARTSIG:1 Tablet(s) By Mouth Every 12 Hours 07/18/22   [provider]  Ubrogepant (UBRELVY) 100 MG TABS Take 100 mg by mouth daily as needed (migraine).    [provider]  valACYclovir (VALTREX) 1000 MG tablet Take 1,000 mg by mouth every 8 (eight) hours as needed (cold sores). 07/23/22   [provider]  venlafaxine XR (EFFEXOR-XR) 150 MG 24 hr capsule Take 150 mg by mouth daily. 07/18/22   [provider]    Physical Exam   Triage Vital Signs: ED Triage Vitals  Enc Vitals Group     BP 09/11/22 1513 125/73     Pulse Rate 09/11/22 1513 78     Resp 09/11/22 1513 16     Temp 09/11/22 1513 98.4 F (36.9 C)     Temp Source 09/11/22 1513 Oral     SpO2 09/11/22 1513 97 %     Weight 09/11/22 1522 191 lb (86.6 kg)     Height 09/11/22 1522 4\' 11"  (1.499 m)     Head Circumference --      Peak Flow --      Pain Score 09/11/22 1522 7     Pain Loc --      Pain Edu? --      Excl. in GC? --     Most recent vital signs: Vitals:   09/11/22 2334 09/12/22 0110  BP: 123/83   Pulse: 72 70  Resp: 18 18  Temp: 98.1 F (36.7 C)   SpO2: 96% 96%    CONSTITUTIONAL: Alert and oriented and responds appropriately to questions. Well-appearing; well-nourished HEAD: Normocephalic, atraumatic EYES: Conjunctivae clear, pupils appear equal, sclera nonicteric ENT: normal nose; moist mucous membranes NECK: Supple, normal ROM CARD: RRR; S1 and S2 appreciated; no murmurs, no clicks, no rubs, no gallops RESP: Normal chest excursion without splinting or tachypnea; breath sounds clear and equal bilaterally; no wheezes, no rhonchi, no rales, no hypoxia or respiratory distress, speaking full sentences ABD/GI: Normal bowel sounds; non-distended; soft, non-tender, no rebound, no guarding, no peritoneal signs BACK: The back appears normal EXT: Normal ROM in all joints; no deformity noted, no edema; no cyanosis SKIN: Normal color for age and race; warm; no rash on exposed skin NEURO: Moves all extremities equally, normal speech, normal gait, cranial nerves II through XII intact, normal sensation diffusely, strength 5/5 in all 4 extremities PSYCH: The patient's mood and manner are appropriate.   ED Results / Procedures / Treatments   LABS: (all labs ordered are listed, but only abnormal results are displayed) Labs Reviewed  CBC - Abnormal; Notable for the following components:      Result Value   Hemoglobin  11.7 (*)    HCT 35.5 (*)    All other components within normal limits  COMPREHENSIVE METABOLIC PANEL - Abnormal; Notable for the following components:   Calcium 8.6 (*)    Total Protein 6.3 (*)    AST 14 (*)    Anion gap 2 (*)    All other components within normal limits  PROTIME-INR  APTT  DIFFERENTIAL  ETHANOL  CBG MONITORING, ED  POC URINE PREG, ED     EKG:  EKG Interpretation  Date/Time:  Monday September 11 2022 15:30:06 EDT Ventricular Rate:  75 PR Interval:  140 QRS Duration: 86 QT Interval:  384 QTC Calculation: 428 R  Axis:   73 Text Interpretation: Normal sinus rhythm Low voltage QRS Nonspecific T wave abnormality Abnormal ECG When compared with ECG of 24-Aug-2022 09:48, QRS axis Shifted left Confirmed by Rochele Raring [phone]) on 09/12/2022 1:50:50 AM         RADIOLOGY: My personal review and interpretation of imaging: CT head and MRI brain showed no acute abnormality.  I have personally reviewed all radiology reports.   MR BRAIN WO CONTRAST  Result Date: 09/11/2022 CLINICAL DATA:  Right facial droop right-sided weakness EXAM: MRI HEAD WITHOUT CONTRAST TECHNIQUE: Multiplanar, multiecho pulse sequences of the brain and surrounding structures were obtained without intravenous contrast. COMPARISON:  None Available. FINDINGS: Brain: No acute infarct, mass effect or extra-axial collection. No acute or chronic hemorrhage. Minimal multifocal hyperintense T2-weight signal within the white matter. The midline structures are normal. Vascular: Major flow voids are preserved. Skull and upper cervical spine: Normal calvarium and skull base. Visualized upper cervical spine and soft tissues are normal. Sinuses/Orbits:No paranasal sinus fluid levels or advanced mucosal thickening. No mastoid or middle ear effusion. Normal orbits. IMPRESSION: Normal brain MRI. Electronically Signed   By: Deatra Robinson M.D.   On: 09/11/2022 20:13   CT HEAD WO CONTRAST  Result Date:  09/11/2022 CLINICAL DATA:  Neuro deficit, acute stroke suspected. Right-sided facial droop that her husband noticed 7:30 a.m. EXAM: CT HEAD WITHOUT CONTRAST TECHNIQUE: Contiguous axial images were obtained from the base of the skull through the vertex without intravenous contrast. RADIATION DOSE REDUCTION: This exam was performed according to the departmental dose-optimization program which includes automated exposure control, adjustment of the mA and/or kV according to patient size and/or use of iterative reconstruction technique. COMPARISON:  CT and MR head examination dated August 05, 2022 FINDINGS: Brain: No evidence of acute infarction, hemorrhage, hydrocephalus, extra-axial collection or mass lesion/mass effect. Unchanged small lacunar infarcts anteriorly in the right corona radiata. Vascular: No hyperdense vessel or unexpected calcification. Skull: Normal. Negative for fracture or focal lesion. Sinuses/Orbits: No acute finding. Other: None. IMPRESSION: No acute intracranial pathology. MRI examination could be considered for further evaluation if clinically warranted. Electronically Signed   By: Larose Hires D.O.   On: 09/11/2022 15:47     PROCEDURES:  Critical Care performed: No     Procedures    IMPRESSION / MDM / ASSESSMENT AND PLAN / ED COURSE  I reviewed the triage vital signs and the nursing notes.    Patient here for concerns for right-sided facial droop and right upper extremity numbness.  Symptoms ongoing for several hours but now resolved.     DIFFERENTIAL DIAGNOSIS (includes but not limited to):   Complex migraine, seizure, CVA, less likely TIA given symptoms ongoing for hours, doubt meningitis, encephalitis, CVT.   Patient's presentation is most consistent with acute presentation with potential threat to life or bodily function.   PLAN: Patient's labs reassuring from triage.  Mild anemia with hemoglobin of 11.7.  No leukocytosis.  Normal electrolytes and renal  function.  Normal coags.  Negative ethanol level.  CT of the head was obtained and reviewed and interpreted by myself and the radiologist and shows no acute abnormality.  MRI of the brain was also ordered from triage and reviewed and interpreted by myself and shows no acute abnormality.  Patient now completely asymptomatic with normal neurologic exam.  We discussed that I have low suspicion for TIA although she does have some risk factors given symptoms ongoing for hours and this seems atypical.  She does have history  of migraine headaches and had a severe headache after this episode that has now resolved.  This could have been a complex migraine.  She states this is not normally how her seizures would present.  I feel given her reassuring work-up here and that she is not having any headache, seizure-like activity, confusion or focal neurologic deficit that she can be discharged home with close outpatient follow-up.  She already has establish care with a neurologist and called their office today prior to coming to the ED.  Patient is comfortable with this plan for discharge home.  We discussed that if she has any return of her neurologic symptoms, headache or any new symptoms concerning to her that she should return to the emergency department immediately.   MEDICATIONS GIVEN IN ED: Medications - No data to display   ED COURSE:  At this time, I do not feel there is any life-threatening condition present. I reviewed all nursing notes, vitals, pertinent previous records.  All lab and urine results, EKGs, imaging ordered have been independently reviewed and interpreted by myself.  I reviewed all available radiology reports from any imaging ordered this visit.  Based on my assessment, I feel the patient is safe to be discharged home without further emergent workup and can continue workup as an outpatient as needed. Discussed all findings, treatment plan as well as usual and customary return precautions.  They  verbalize understanding and are comfortable with this plan.  Outpatient follow-up has been provided as needed.  All questions have been answered.    CONSULTS: Admission considered but work-up reassuring and patient is comfortable with plan for outpatient follow-up with her neurologist.   OUTSIDE RECORDS REVIEWED: Reviewed patient's last office visit with [facility] neurology on 05/29/2022.       FINAL CLINICAL IMPRESSION(S) / ED DIAGNOSES   Final diagnoses:  Stroke-like symptoms     Rx / DC Orders   ED Discharge Orders     None        Note:  This document was prepared using Dragon voice recognition software and may include unintentional dictation errors.   Lasheka Kempner, Layla Maw, DO 09/12/22 [phone]

## 2022-09-12 NOTE — Telephone Encounter (Signed)
Pt is calling. Stated she was at the emergency room last night for stroke like symptoms. Pt is requesting a nurse call back.

## 2022-09-20 ENCOUNTER — Ambulatory Visit: Payer: 59 | Admitting: Neurology

## 2022-09-21 ENCOUNTER — Ambulatory Visit: Payer: Medicare Other | Admitting: Neurology

## 2022-10-03 ENCOUNTER — Other Ambulatory Visit: Payer: Self-pay | Admitting: Neurology

## 2023-02-12 ENCOUNTER — Other Ambulatory Visit: Payer: Self-pay | Admitting: Neurology

## 2023-06-21 IMAGING — CR DG CHEST 2V
1 series · 2 of 2 positions shown · non-contrast
Comparison: None.

CLINICAL DATA: Centralized chest pain since yesterday.

EXAM:
CHEST - 2 VIEW

[Series 1: dg chest 2 view · 0.14mm/px · 2 of 2 slices shown]
[im 1/2]
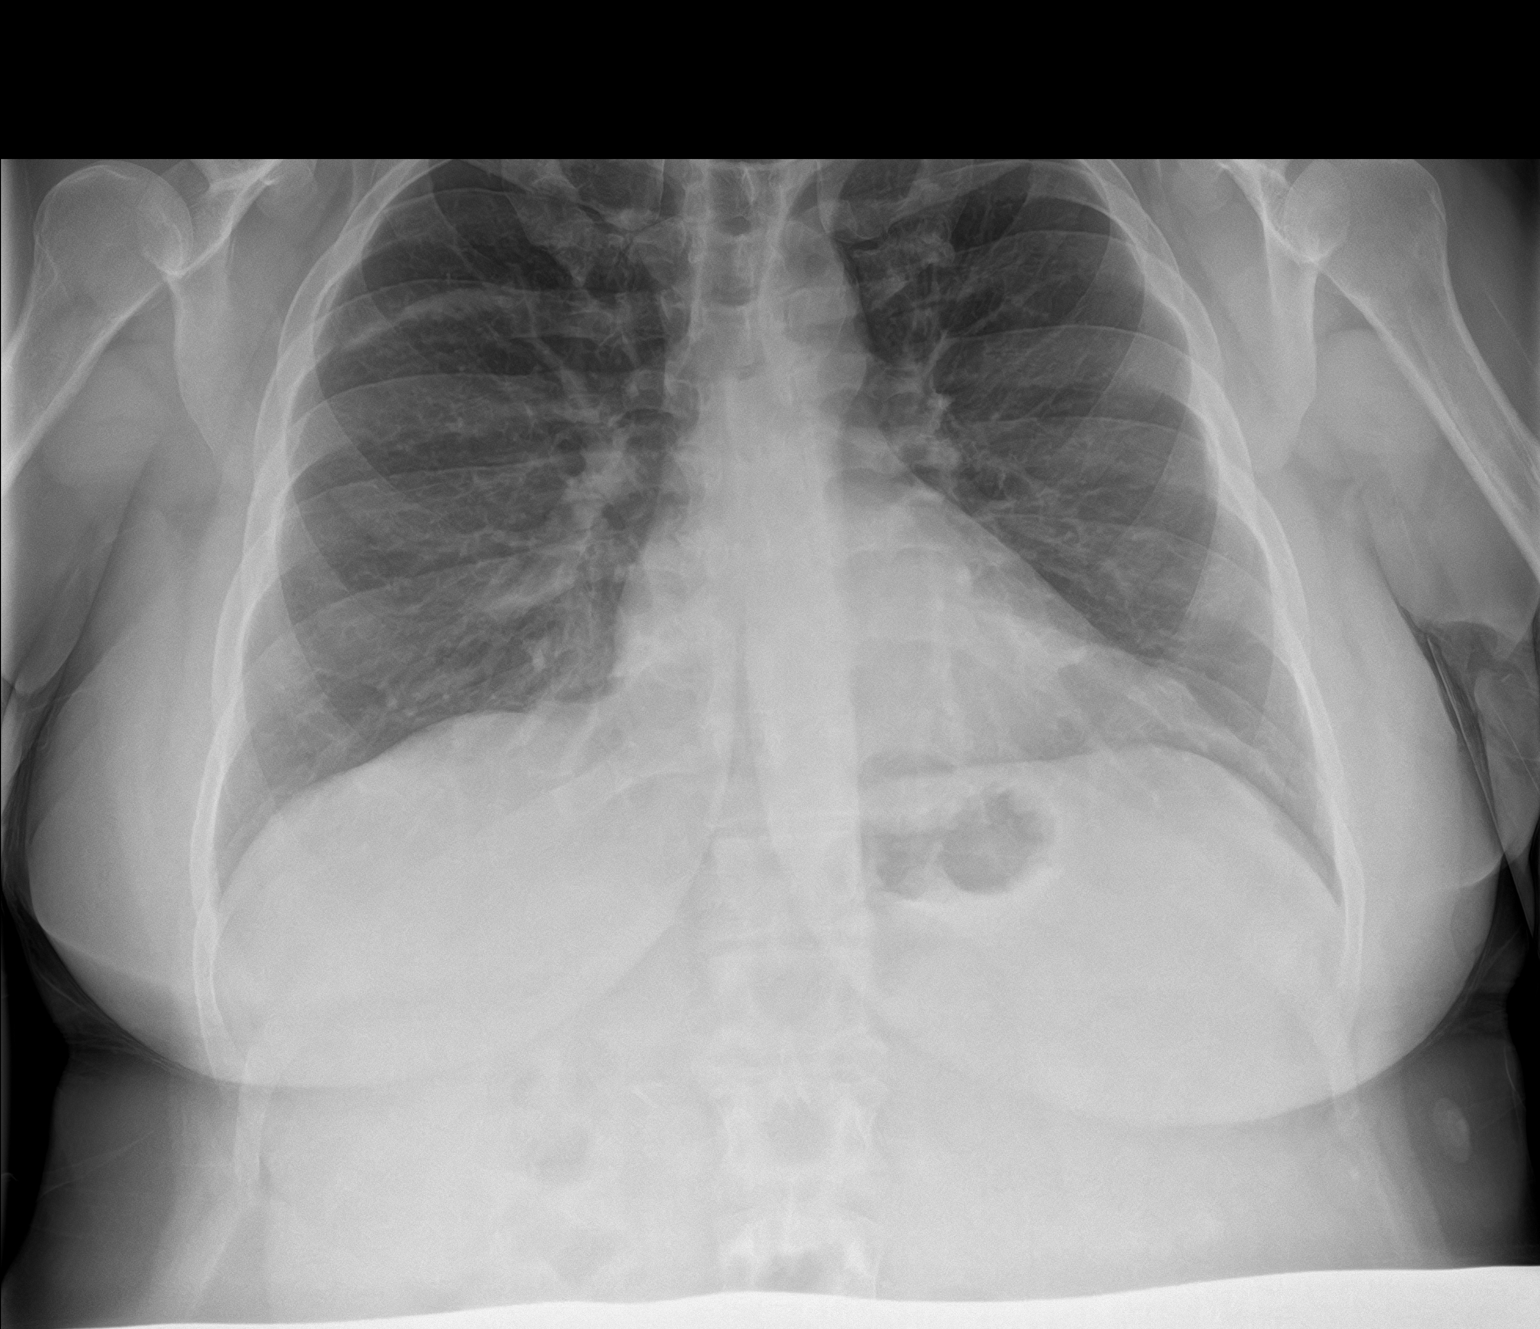
[im 2/2]
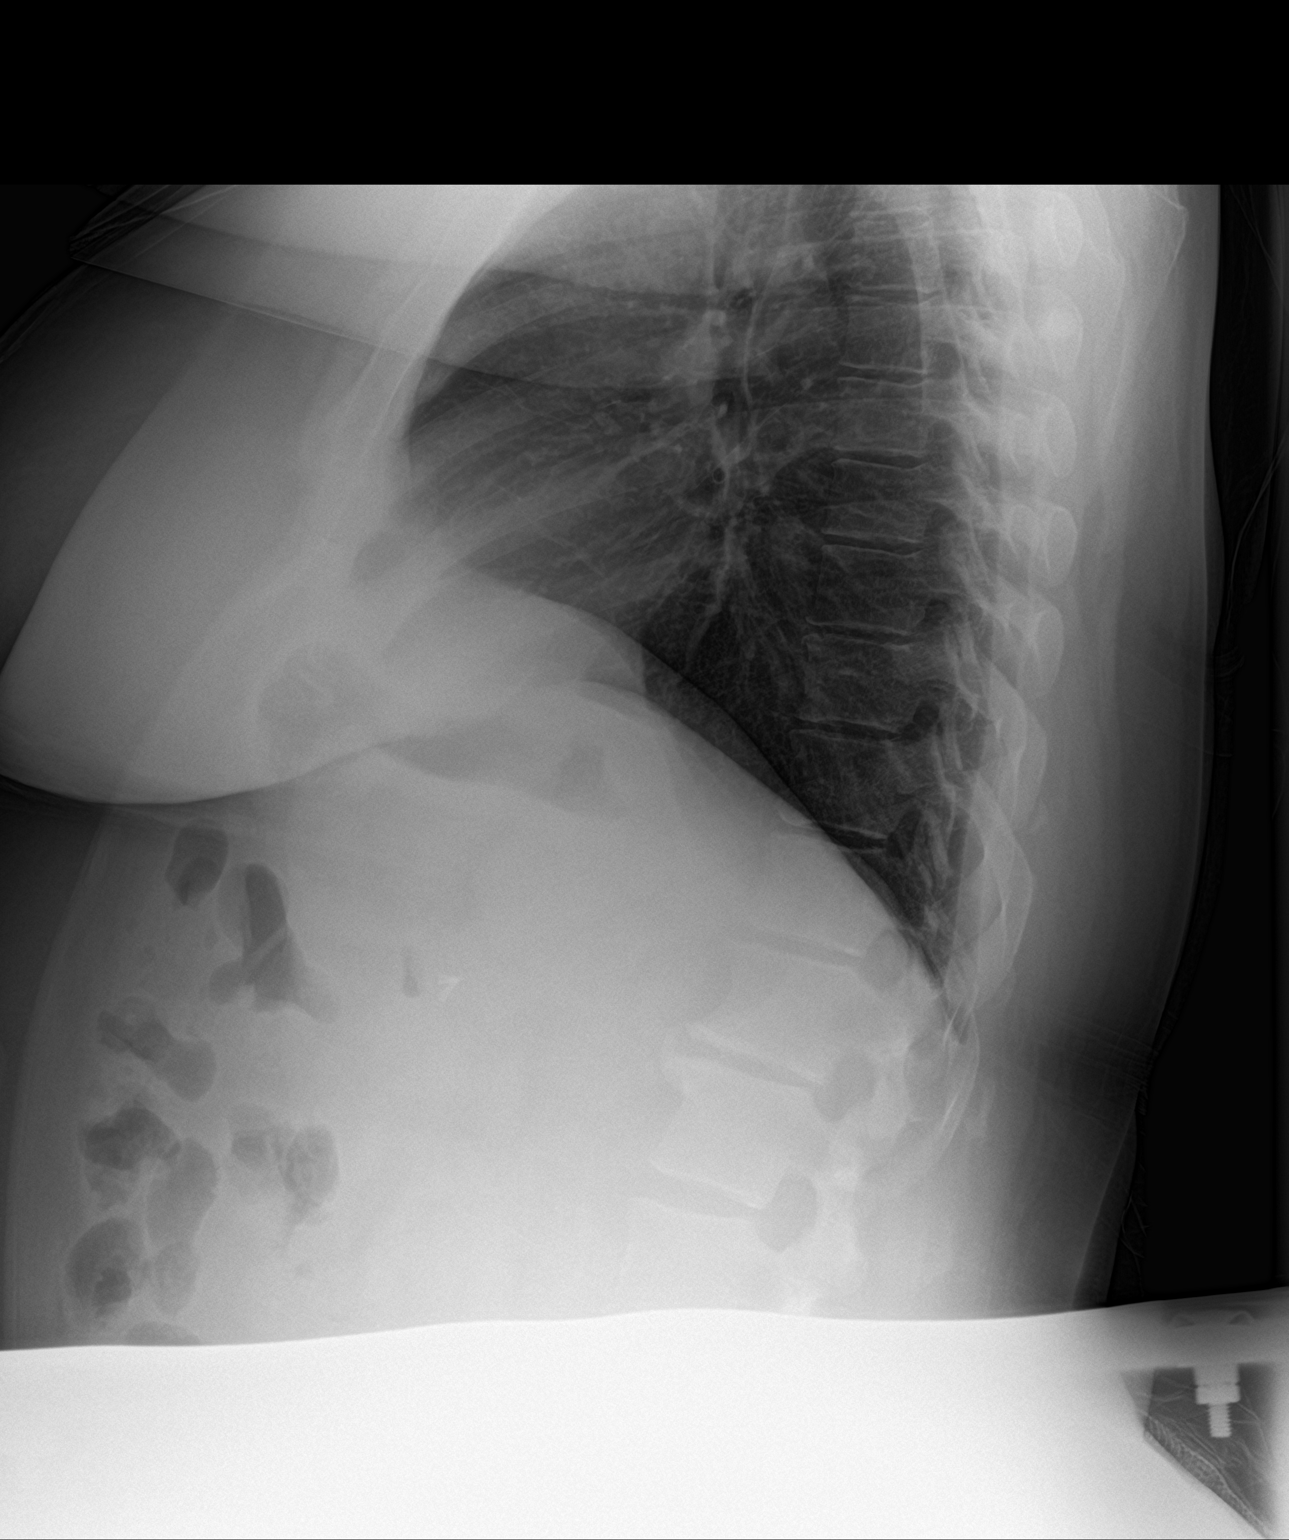

[2 of 2 positions shown; findings below may reference images not displayed]

FINDINGS: Normal sized heart. Clear lungs. Minimal peribronchial thickening.
Cholecystectomy clips. Thoracolumbar spine degenerative changes.
IMPRESSION: Minimal bronchitic changes.

## 2023-08-19 ENCOUNTER — Other Ambulatory Visit: Payer: Self-pay

## 2023-08-19 ENCOUNTER — Emergency Department (HOSPITAL_BASED_OUTPATIENT_CLINIC_OR_DEPARTMENT_OTHER)
Admission: EM | Admit: 2023-08-19 | Discharge: 2023-08-19 | Disposition: A | Discharge status: Home / Self Care | Payer: 59 | Attending: Emergency Medicine | Admitting: Emergency Medicine

## 2023-08-19 ENCOUNTER — Encounter (HOSPITAL_BASED_OUTPATIENT_CLINIC_OR_DEPARTMENT_OTHER): Payer: Self-pay | Admitting: Urology

## 2023-08-19 DIAGNOSIS — R42 Dizziness and giddiness: Secondary | ICD-10-CM | POA: Insufficient documentation

## 2023-08-19 DIAGNOSIS — R002 Palpitations: Secondary | ICD-10-CM | POA: Insufficient documentation

## 2023-08-19 DIAGNOSIS — Z91010 Allergy to peanuts: Secondary | ICD-10-CM | POA: Insufficient documentation

## 2023-08-19 DIAGNOSIS — I10 Essential (primary) hypertension: Secondary | ICD-10-CM | POA: Diagnosis not present

## 2023-08-19 DIAGNOSIS — E039 Hypothyroidism, unspecified: Secondary | ICD-10-CM | POA: Insufficient documentation

## 2023-08-19 DIAGNOSIS — Z79899 Other long term (current) drug therapy: Secondary | ICD-10-CM | POA: Diagnosis not present

## 2023-08-19 DIAGNOSIS — J45909 Unspecified asthma, uncomplicated: Secondary | ICD-10-CM | POA: Insufficient documentation

## 2023-08-19 LAB — BASIC METABOLIC PANEL
Anion gap: 8 (ref 5–15)
BUN: 11 mg/dL (ref 6–20)
CO2: 23 mmol/L (ref 22–32)
Calcium: 9.1 mg/dL (ref 8.9–10.3)
Chloride: 106 mmol/L (ref 98–111)
Creatinine, Ser: 1.01 mg/dL — ABNORMAL HIGH (ref 0.44–1.00)
GFR, Estimated: >60 mL/min (ref >60)
Glucose, Bld: 106 mg/dL — ABNORMAL HIGH (ref 70–99)
Potassium: 4.1 mmol/L (ref 3.5–5.1)
Sodium: 137 mmol/L (ref 135–145)

## 2023-08-19 LAB — URINALYSIS, ROUTINE W REFLEX MICROSCOPIC
Bilirubin Urine: NEGATIVE
Glucose, UA: NEGATIVE mg/dL
Hgb urine dipstick: NEGATIVE
Ketones, ur: NEGATIVE mg/dL
Leukocytes,Ua: NEGATIVE
Nitrite: NEGATIVE
Protein, ur: NEGATIVE mg/dL
Specific Gravity, Urine: 1.01 (ref 1.005–1.030)
pH: 7 (ref 5.0–8.0)

## 2023-08-19 LAB — CBC
HCT: 43.8 % (ref 36.0–46.0)
Hemoglobin: 14.4 g/dL (ref 12.0–15.0)
MCH: 28.7 pg (ref 26.0–34.0)
MCHC: 32.9 g/dL (ref 30.0–36.0)
MCV: 87.3 fL (ref 80.0–100.0)
Platelets: 340 10*3/uL (ref 150–400)
RBC: 5.02 MIL/uL (ref 3.87–5.11)
RDW: 12.5 % (ref 11.5–15.5)
WBC: 6.6 10*3/uL (ref 4.0–10.5)
nRBC: 0 % (ref 0.0–0.2)

## 2023-08-19 NOTE — ED Triage Notes (Signed)
Pt states " I feel like I'm here but I'm not" states feels like heart is racing and feel like I'm going to pass out  States been going on 8 weeks  States dry cough as well

## 2023-08-19 NOTE — ED Notes (Signed)
ED Provider at bedside. 

## 2023-08-19 NOTE — ED Provider Notes (Signed)
[place] [facility] AT [facility] Provider Note   CSN: [id number] Arrival date & time: 08/19/23  1206     History  Chief Complaint  Patient presents with   Near Syncope    Brianna [name] is a 42 y.o. female.  Patient with hypertension, hypothyroidism, seizure disorder, bipolar affective disorder, anxiety, migraine headaches, asthma, IBS, chronic low back pain, history of left nephrectomy, frequent emergency department visits over the past several months for chest pain --presents for evaluation of lightheadedness.  Patient describes a sensation as "not feeling completely here".  She has associated palpitations like her heart is racing.  No chest pain or shortness of breath.  However she has had recent chest pains and multiple workups for pain over the past 8 or so weeks.  No syncope.  No headache.  No nausea, vomiting, or diarrhea.  She was concerned because she had a recent CT scan at [facility] and was told that she had a problem with her iliac vein and "someone used the word blocked".  She has pain in her bilateral legs at night and swelling bilaterally.  No history of blood clots.  CTA abd/pelvis 08/09/23:  IMPRESSION: 1. No significant stenosis or aneurysmal dilatation of the arterial structures. 2. 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 Correlation with lower extremity symptoms. If symptomatic, a venous reflux study could BE obtained for further evaluation.          Home Medications Prior to Admission medications   Medication Sig Start Date End Date Taking? Authorizing Provider  amitriptyline (ELAVIL) 25 MG tablet Take 25 mg by mouth daily. 07/07/22   [provider]  cephALEXin (KEFLEX) 500 MG capsule Take 1 capsule (500 mg total) by mouth 4 (four) times daily. 08/05/22   Ernie Avena, MD  Erenumab-aooe (AIMOVIG) 140 MG/ML SOAJ INJECT 140 MG INTO THE SKIN EVERY 30 DAYS 02/13/23    Windell Norfolk, MD  escitalopram (LEXAPRO) 20 MG tablet Take 20 mg by mouth daily.    [provider]  ferrous sulfate 325 (65 FE) MG tablet Take 325 mg by mouth daily with breakfast.    [provider]  gabapentin (NEURONTIN) 100 MG capsule Take 100 mg by mouth at bedtime.    [provider]  HUMIRA PEN-CD/UC/HS STARTER 80 MG/0.8ML PNKT Inject 0.8 mLs into the skin every 14 (fourteen) days. 02/10/22   [provider]  hydrOXYzine (ATARAX) 10 MG tablet Take 10 mg by mouth every 8 (eight) hours as needed for itching or anxiety. 07/18/22   [provider]  ibuprofen (ADVIL) 800 MG tablet Take 800 mg by mouth at bedtime.    [provider]  levothyroxine (SYNTHROID) 25 MCG tablet Take 25 mcg by mouth at bedtime.    [provider]  lisinopril (ZESTRIL) 5 MG tablet Take 5 mg by mouth daily.    [provider]  losartan (COZAAR) 50 MG tablet Take 50 mg by mouth daily. 06/20/22   [provider]  ondansetron (ZOFRAN) 4 MG tablet Take 4 mg by mouth every 8 (eight) hours as needed for nausea or vomiting. 07/18/22   [provider]  promethazine (PHENERGAN) 25 MG tablet Take 25 mg by mouth every 6 (six) hours as needed for nausea or vomiting. 07/29/22   [provider]  rosuvastatin (CRESTOR) 10 MG tablet Take 1 tablet (10 mg total) by mouth daily. 07/12/22   Arty Baumgartner, NP  sulfamethoxazole-trimethoprim (BACTRIM DS) 800-160 MG tablet SMARTSIG:1 Tablet(s) By Mouth Every 12 Hours 07/18/22   [provider]  Ubrogepant (UBRELVY) 100 MG TABS Take 100 mg by mouth daily as needed (migraine).    [provider]  valACYclovir (VALTREX) 1000 MG tablet Take 1,000 mg by mouth every 8 (eight) hours as needed (cold sores). 07/23/22   [provider]  venlafaxine XR (EFFEXOR-XR) 150 MG 24 hr capsule Take 150 mg by mouth daily. 07/18/22   [provider]      Allergies    Hydrocodone,  Peanut oil, Fentanyl, Hydrocodone, and Neosporin [bacitracin-polymyxin b]    Review of Systems   Review of Systems  Physical Exam Updated Vital Signs BP 111/68   Pulse (!) 101   Temp (!) 97.1 F (36.2 C)   Resp 18   Ht 4\' 11"  (1.499 m)   Wt 86.6 kg   SpO2 100%   BMI 38.56 kg/m   Physical Exam Vitals and nursing note reviewed.  Constitutional:      Appearance: She is well-developed. She is not diaphoretic.  HENT:     Head: Normocephalic and atraumatic.     Nose: Nose normal.     Mouth/Throat:     Mouth: Mucous membranes are moist. Mucous membranes are not dry.  Eyes:     Conjunctiva/sclera: Conjunctivae normal.  Neck:     Vascular: Normal carotid pulses. No JVD.     Trachea: Trachea normal. No tracheal deviation.  Cardiovascular:     Rate and Rhythm: Normal rate and regular rhythm.     Pulses: No decreased pulses.          Radial pulses are 2+ on the right side and 2+ on the left side.     Heart sounds: Normal heart sounds, S1 normal and S2 normal. No murmur heard. Pulmonary:     Effort: Pulmonary effort is normal. No respiratory distress.     Breath sounds: No wheezing.  Chest:     Chest wall: No tenderness.  Abdominal:     General: Bowel sounds are normal.     Palpations: Abdomen is soft.     Tenderness: There is no abdominal tenderness. There is no guarding or rebound.  Musculoskeletal:        General: Normal range of motion.     Cervical back: Normal range of motion and neck supple. No muscular tenderness.     Right lower leg: Edema present.     Left lower leg: Edema present.     Comments: Trace symmetric bilateral extremity edema.  No focal tenderness or swelling of the left lower extremity.  Skin:    General: Skin is warm and dry.     Coloration: Skin is not pale.  Neurological:     Mental Status: She is alert.     ED Results / Procedures / Treatments   Labs (all labs ordered are listed, but only abnormal results are displayed) Labs Reviewed  BASIC  METABOLIC PANEL - Abnormal; Notable for the following components:      Result Value   Glucose, Bld 106 (*)    Creatinine, Ser 1.01 (*)    All other components within normal limits  CBC  URINALYSIS, ROUTINE W REFLEX MICROSCOPIC  CBG MONITORING, ED    ED ECG REPORT   Date: 08/19/2023  Rate: 94  Rhythm: normal sinus rhythm  QRS Axis: left  Intervals: normal  ST/T Wave abnormalities: nonspecific T wave changes  Conduction Disutrbances:none  Narrative Interpretation:  Old EKG Reviewed: unchanged from 09/2022  I have personally reviewed the EKG tracing and agree with the computerized printout as noted.   Radiology No results found.  Procedures Procedures    Medications Ordered in ED Medications - No data to display  ED Course/ Medical Decision Making/ A&P    Patient seen and examined. History obtained directly from patient and family member at bedside.  I was able to review the patient's imaging from 08/09/2023.  I was able to review several recent ED visits from outside system.  She has had extensive evaluation.  Pertinent recent studies include: 07/17/2023: neg ct cap with contrast 05/26/2023: neg ct-a chest  07/2022: neg echo 07/24/2023: neg trop and delta  08/01/2023: neg trop x1 She has a tilt table test scheduled.  She states that she was not able to go to her recent sleep study.  Labs/EKG: Ordered CBC, BMP, UA.  Imaging: None ordered  Medications/Fluids: None ordered  Most recent vital signs reviewed and are as follows: BP 111/68   Pulse (!) 101   Temp (!) 97.1 F (36.2 C)   Resp 18   Ht 4\' 11"  (1.499 m)   Wt 86.6 kg   SpO2 100%   BMI 38.56 kg/m   Initial impression: Nonspecific lightheadedness, no stroke symptoms.  I reviewed her recent CT.  She does not have clinical signs and symptoms of lower extremity DVT.    2:32 PM Reassessment performed. Patient appears stable.  Labs personally reviewed and interpreted including: CBC unremarkable; BMP with  glucose 106, minimally elevated creatinine at 1.01 otherwise unremarkable; UA normal sinus infection.  Reviewed pertinent lab work and imaging with patient at bedside. Questions answered.   Most current vital signs reviewed and are as follows: BP 111/68   Pulse (!) 101   Temp (!) 97.1 F (36.2 C)   Resp 18   Ht 4\' 11"  (1.499 m)   Wt 86.6 kg   SpO2 100%   BMI 38.56 kg/m   Plan: Discharge to home.   Prescriptions written for: None  Other home care instructions discussed: Rest, hydration  ED return instructions discussed: Development of chest pain, shortness of breath, full syncope, lower extremity swelling worse than baseline.   Patient counseled to return if they have weakness in their arms or legs, slurred speech, trouble walking or talking, confusion, trouble with their balance, or if they have any other concerns.   Follow-up instructions discussed: Patient encouraged to follow-up with their PCP in 3 days.                                 Medical Decision Making Amount and/or Complexity of Data Reviewed Labs: ordered.   Patient with nonspecific lightheadedness without focal neurologic symptoms.  No chest pain or shortness of breath today.  She is anxious over recent CT results, I attempted to reassure the patient that she does not clinically have signs symptoms of a DVT today and there were no signs of blood clot on her CT from previous.  Checked labs, EKG, urine today all which were unremarkable.  Vital signs remained stable.  She looks well.  Encouraged outpatient PCP follow-up as planned.  She does have a tilt table test upcoming in 2 days.  The patient's vital signs, pertinent lab work and imaging were reviewed and interpreted as discussed in the ED course. Hospitalization was considered for further testing, treatments, or serial exams/observation. However as patient  is well-appearing, has a stable exam, and reassuring studies today, I do not feel that they warrant admission  at this time. This plan was discussed with the patient who verbalizes agreement and comfort with this plan and seems reliable and able to return to the Emergency Department with worsening or changing symptoms.          Final Clinical Impression(s) / ED Diagnoses Final diagnoses:  Lightheadedness  Palpitations    Rx / DC Orders ED Discharge Orders     None         Renne Crigler, PA-C 08/19/23 1434    Arby Barrette, MD 08/19/23 1622

## 2023-08-19 NOTE — Discharge Instructions (Addendum)
Please read and follow all provided instructions.  Your diagnoses today include:  1. Lightheadedness   2. Palpitations     Tests performed today include: Complete blood cell count: No problems Basic metabolic panel: No problems EKG: Was unchanged from previous without fast heart rate or abnormal heart rhythms Urinalysis (urine test): Was normal Vital signs. See below for your results today.   Medications prescribed:  None  Take any prescribed medications only as directed.  Home care instructions:  Follow any educational materials contained in this packet.  BE VERY CAREFUL not to take multiple medicines containing Tylenol (also called acetaminophen). Doing so can lead to an overdose which can damage your liver and cause liver failure and possibly death.   Follow-up instructions: Please follow-up with your primary care provider in the next 3 days for further evaluation of your symptoms.   Return instructions:  Please return to the Emergency Department if you experience worsening symptoms.  Return if you pass out completely, develop chest pain or shortness of breath, develop worsening swelling or pain in your left leg Please return if you have any other emergent concerns.  Additional Information:  Your vital signs today were: BP 111/68   Pulse (!) 101   Temp (!) 97.1 F (36.2 C)   Resp 18   Ht 4\' 11"  (1.499 m)   Wt 86.6 kg   SpO2 100%   BMI 38.56 kg/m  If your blood pressure (BP) was elevated above 135/85 this visit, please have this repeated by your doctor within one month. --------------

## 2023-12-02 IMAGING — CR DG CHEST 2V
1 series · 2 of 2 positions shown · non-contrast
Comparison: October 10, 2021

CLINICAL DATA: URI symptoms and cough x1 week.

EXAM:
CHEST - 2 VIEW

[Series 1: dg chest 2 view · 0.14mm/px · 2 of 2 slices shown]
[im 1/2]
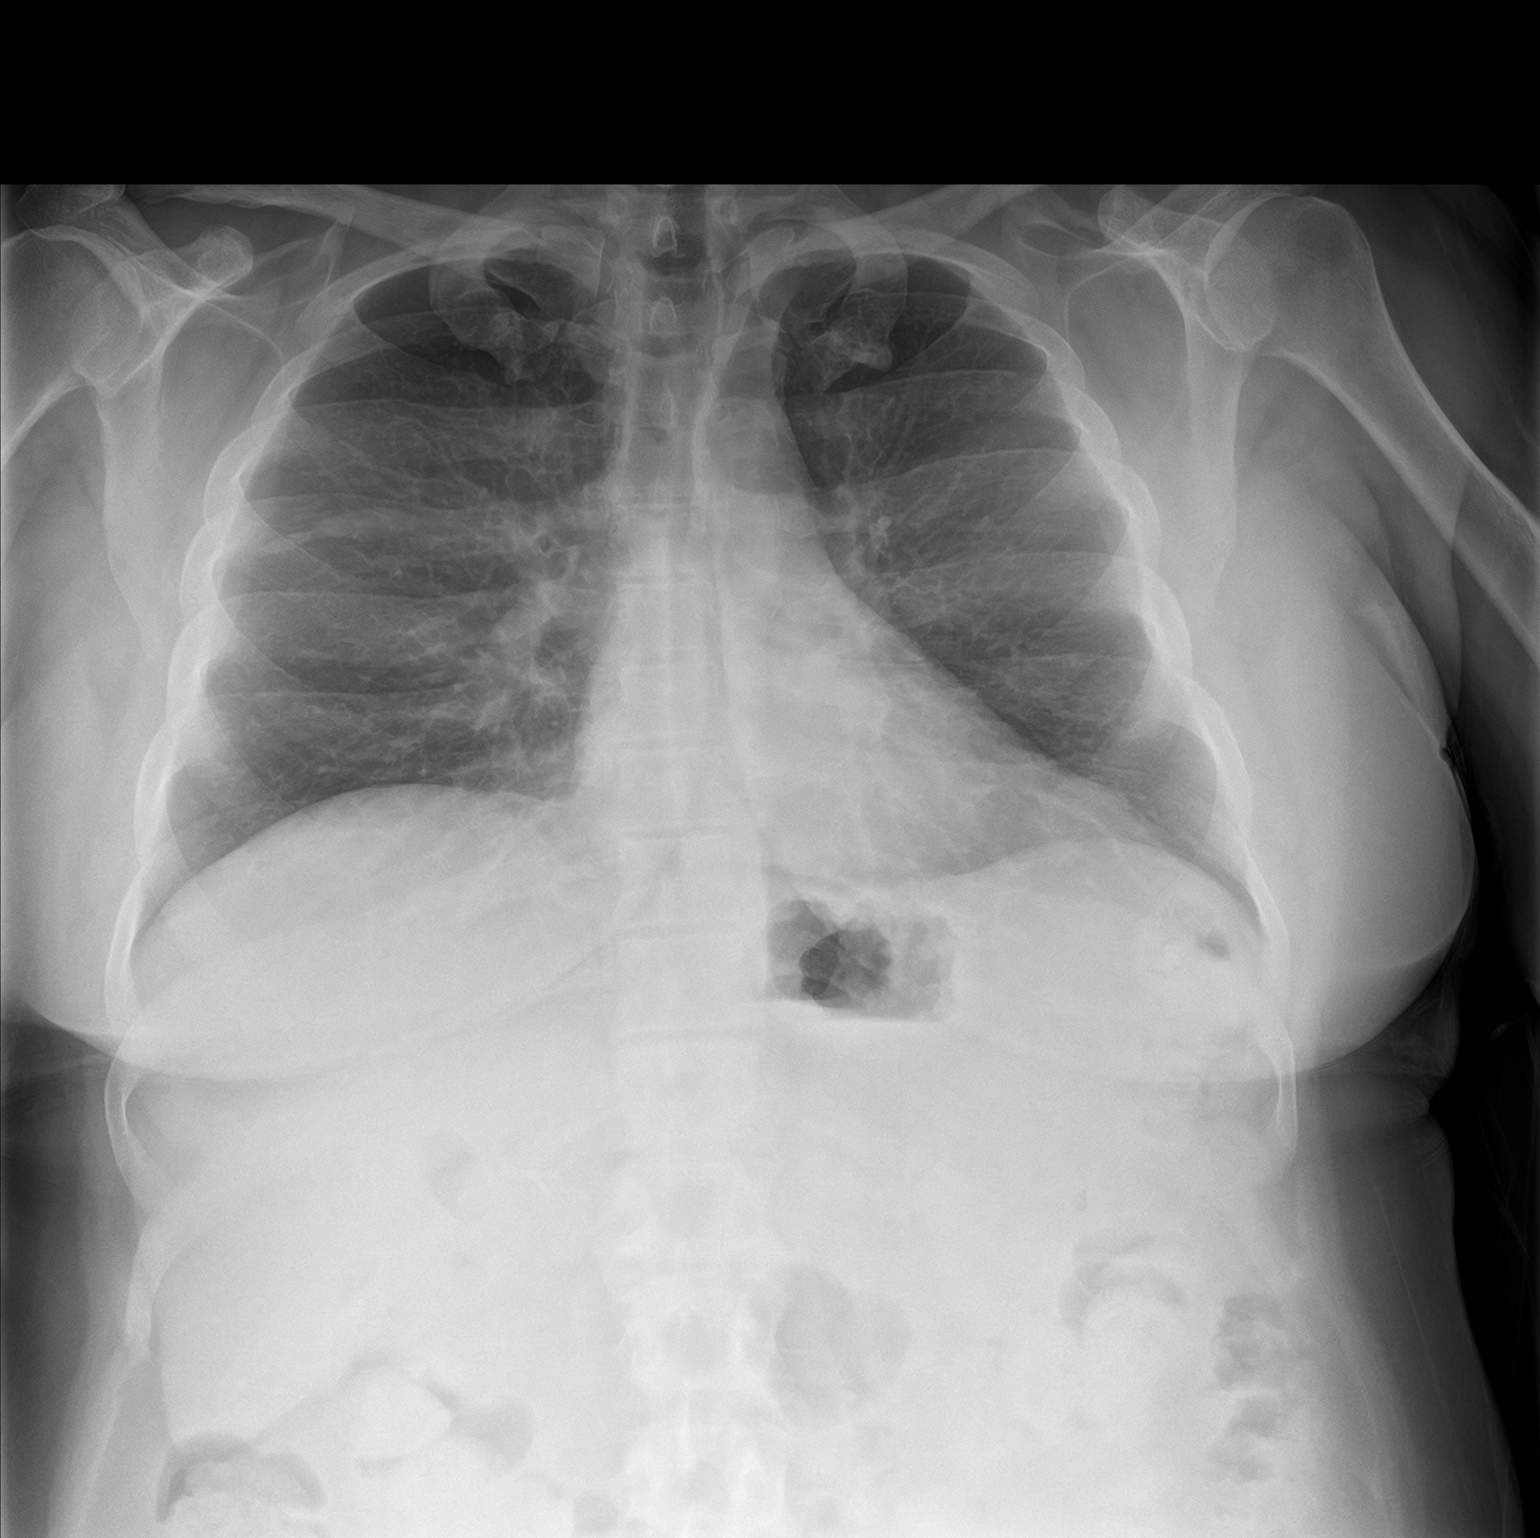
[im 2/2]
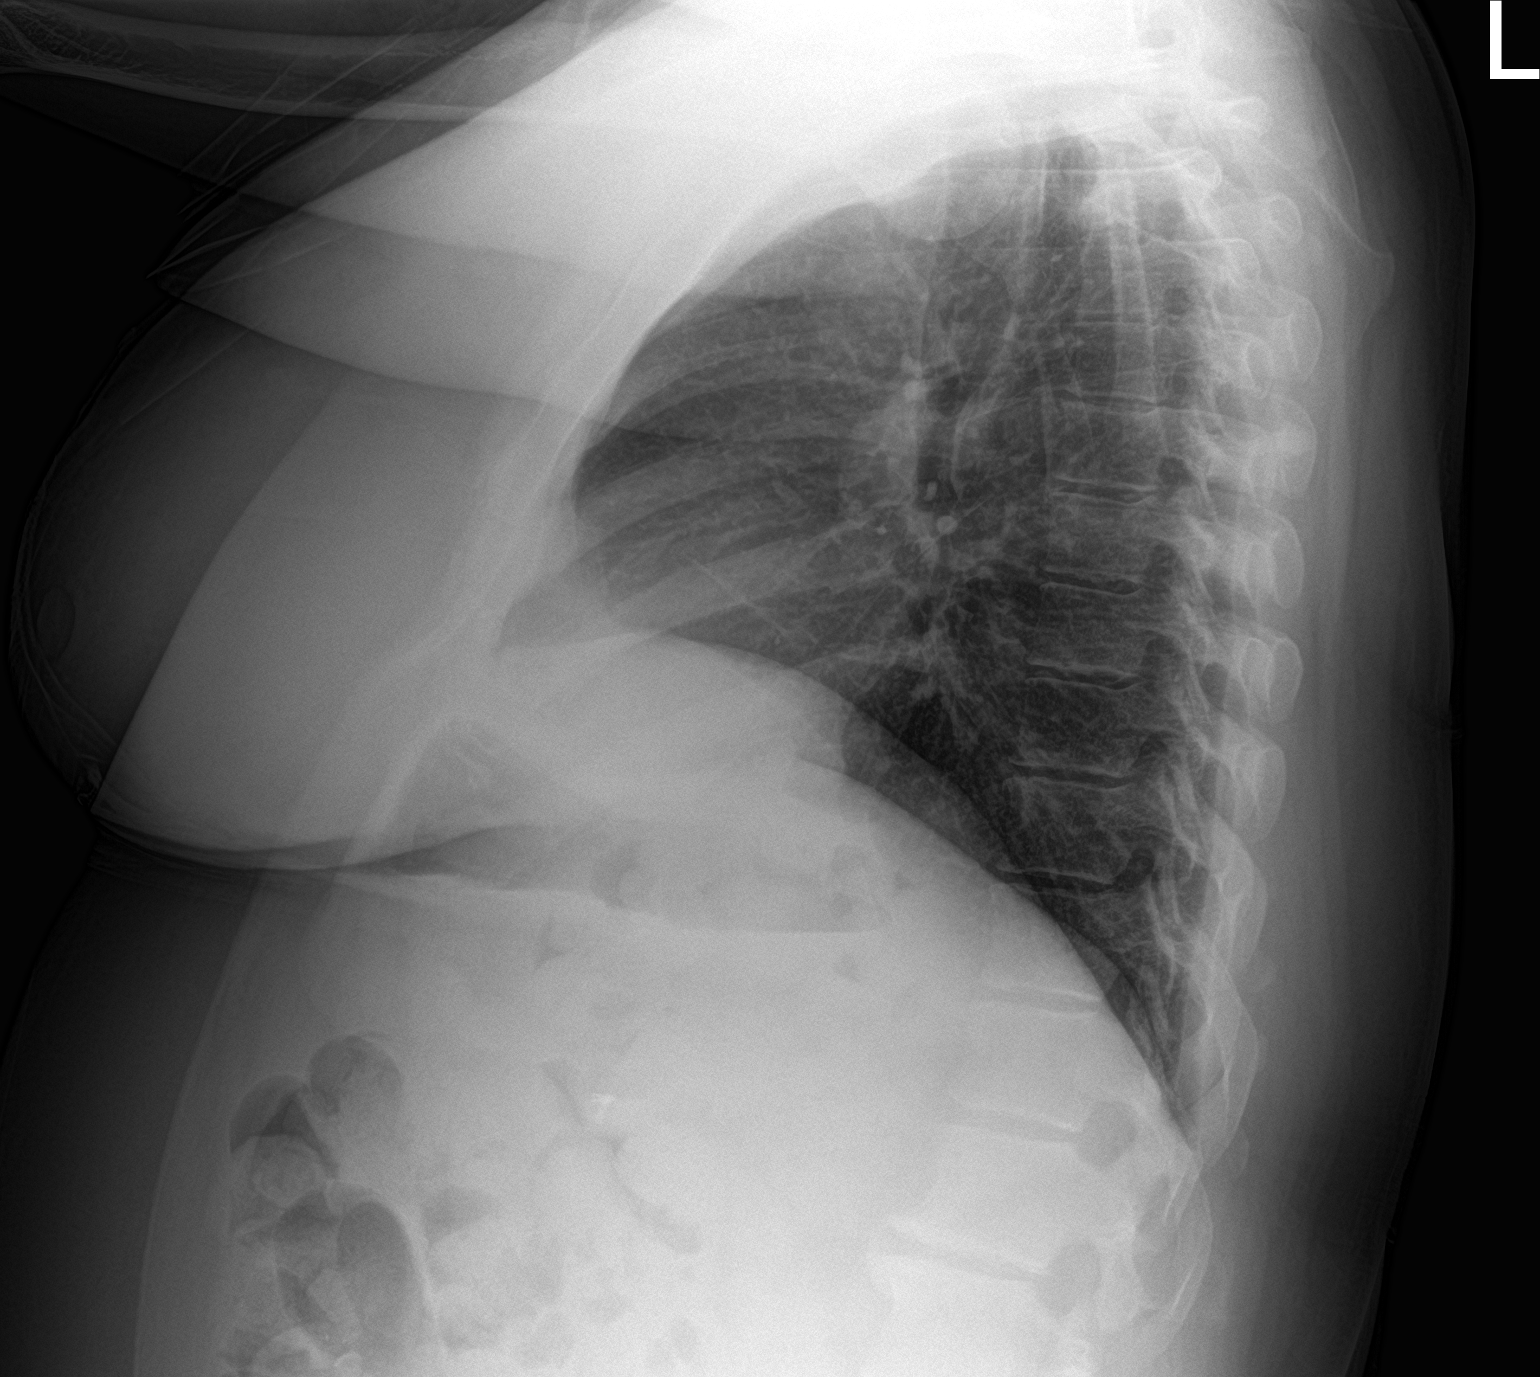

[2 of 2 positions shown; findings below may reference images not displayed]

FINDINGS: The heart size and mediastinal contours are within normal limits.
Mildly decreased lung volumes are noted. Both lungs are otherwise
clear. Radiopaque surgical clips are seen within the right upper
quadrant. The visualized skeletal structures are unremarkable.
IMPRESSION: No active cardiopulmonary disease.

## 2024-07-01 ENCOUNTER — Emergency Department (HOSPITAL_COMMUNITY)
Admission: EM | Admit: 2024-07-01 | Discharge: 2024-07-01 | Discharge status: Against Medical Advice | Attending: Emergency Medicine | Admitting: Emergency Medicine

## 2024-07-01 ENCOUNTER — Other Ambulatory Visit: Payer: Self-pay

## 2024-07-01 ENCOUNTER — Emergency Department (HOSPITAL_COMMUNITY)

## 2024-07-01 ENCOUNTER — Encounter (HOSPITAL_COMMUNITY): Payer: Self-pay

## 2024-07-01 DIAGNOSIS — R110 Nausea: Secondary | ICD-10-CM | POA: Diagnosis not present

## 2024-07-01 DIAGNOSIS — R059 Cough, unspecified: Secondary | ICD-10-CM | POA: Diagnosis not present

## 2024-07-01 DIAGNOSIS — I959 Hypotension, unspecified: Secondary | ICD-10-CM | POA: Insufficient documentation

## 2024-07-01 DIAGNOSIS — R42 Dizziness and giddiness: Secondary | ICD-10-CM | POA: Insufficient documentation

## 2024-07-01 DIAGNOSIS — I445 Left posterior fascicular block: Secondary | ICD-10-CM | POA: Insufficient documentation

## 2024-07-01 DIAGNOSIS — R0789 Other chest pain: Secondary | ICD-10-CM | POA: Diagnosis present

## 2024-07-01 DIAGNOSIS — Z5321 Procedure and treatment not carried out due to patient leaving prior to being seen by health care provider: Secondary | ICD-10-CM | POA: Insufficient documentation

## 2024-07-01 LAB — CBC
HCT: 40.8 % (ref 36.0–46.0)
Hemoglobin: 13 g/dL (ref 12.0–15.0)
MCH: 29.4 pg (ref 26.0–34.0)
MCHC: 31.9 g/dL (ref 30.0–36.0)
MCV: 92.3 fL (ref 80.0–100.0)
Platelets: 305 K/uL (ref 150–400)
RBC: 4.42 MIL/uL (ref 3.87–5.11)
RDW: 13.8 % (ref 11.5–15.5)
WBC: 12.3 K/uL — ABNORMAL HIGH (ref 4.0–10.5)
nRBC: 0 % (ref 0.0–0.2)

## 2024-07-01 LAB — BRAIN NATRIURETIC PEPTIDE: B Natriuretic Peptide: 16.3 pg/mL (ref 0.0–100.0)

## 2024-07-01 LAB — BASIC METABOLIC PANEL WITH GFR
Anion gap: 11 (ref 5–15)
BUN: 12 mg/dL (ref 6–20)
CO2: 23 mmol/L (ref 22–32)
Calcium: 8.7 mg/dL — ABNORMAL LOW (ref 8.9–10.3)
Chloride: 102 mmol/L (ref 98–111)
Creatinine, Ser: 1.18 mg/dL — ABNORMAL HIGH (ref 0.44–1.00)
GFR, Estimated: 59 mL/min — ABNORMAL LOW (ref >60)
Glucose, Bld: 107 mg/dL — ABNORMAL HIGH (ref 70–99)
Potassium: 4 mmol/L (ref 3.5–5.1)
Sodium: 136 mmol/L (ref 135–145)

## 2024-07-01 LAB — TROPONIN I (HIGH SENSITIVITY): Troponin I (High Sensitivity): 4 ng/L (ref <18)

## 2024-07-01 MED ORDER — ONDANSETRON 4 MG PO TBDP
4.0000 mg | ORAL_TABLET | Freq: Once | ORAL | Status: AC
  Administered 2024-07-01: 4 mg via ORAL
  Filled 2024-07-01: qty 1

## 2024-07-01 NOTE — ED Provider Triage Note (Signed)
 Emergency Medicine Provider Triage Evaluation Note  Brianna [name] , a 43 y.o. female  was evaluated in triage.  Pt complains of chest pain that radiates to L arm and jaw. Patient has hx of angina, states that this has happened before. Has been seen by multiple specialists outpatient and wanted to come to a bigger hospital to be evaluated. Recently saw pulmonology who was concerned about heart as well as lungs. Patient also appreciates dizziness, hypotension, as well as episodes of oxygen desaturation. 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 Reports MI and hx of stroke. Appreciates seeing black spots in vision.  Denies acute strokelike symptoms, however does report some deficits from previous stroke.  Review of Systems  Positive: Chest pain, dizziness, nausea, hypotension, chills, night sweats Negative: fever  Physical Exam  BP 127/80 (BP Location: Left Arm)   Pulse (!) 54   Temp (!) 97.3 F (36.3 C)   Resp 17   SpO2 97%  Gen:   Awake, no distress   Resp:  Normal effort  MSK:   Moves extremities without difficulty  Other:  Patient talking on room air in full sentences, vital signs stable, patient not in acute distress, no obvious abnormalities with auscultation of heart or lungs, patient sitting in wheelchair  Medical Decision Making  Medically screening exam initiated at 3:00 PM.  Appropriate orders placed.  Brianna [name] was informed that the remainder of the evaluation will be completed by another provider, this initial triage assessment does not replace that evaluation, and the importance of remaining in the ED until their evaluation is complete.  Orders: CBC, CMP, troponin, EKG, head CT, CXR, BNP   Janetta Terrall FALCON, PA-C 07/01/24 1512

## 2024-07-01 NOTE — ED Notes (Signed)
 Pt states they are going to another hospital.

## 2024-07-01 NOTE — ED Triage Notes (Signed)
 Pt c/o intermittent CP x 2 years; last night having intermittent cp radiating to L arm, dizziness today; endorses hypotension, nausea, cough; hx angina; denies fevers but states has been having night sweats

## 2024-07-03 ENCOUNTER — Other Ambulatory Visit: Payer: Self-pay

## 2024-07-03 ENCOUNTER — Emergency Department

## 2024-07-03 ENCOUNTER — Encounter: Payer: Self-pay | Admitting: Emergency Medicine

## 2024-07-03 ENCOUNTER — Emergency Department
Admission: EM | Admit: 2024-07-03 | Discharge: 2024-07-03 | Disposition: A | Discharge status: Home / Self Care | Attending: Emergency Medicine | Admitting: Emergency Medicine

## 2024-07-03 DIAGNOSIS — R0602 Shortness of breath: Secondary | ICD-10-CM | POA: Diagnosis not present

## 2024-07-03 DIAGNOSIS — J45909 Unspecified asthma, uncomplicated: Secondary | ICD-10-CM | POA: Insufficient documentation

## 2024-07-03 DIAGNOSIS — I10 Essential (primary) hypertension: Secondary | ICD-10-CM | POA: Diagnosis not present

## 2024-07-03 DIAGNOSIS — R079 Chest pain, unspecified: Secondary | ICD-10-CM | POA: Insufficient documentation

## 2024-07-03 LAB — CBC
HCT: 39.2 % (ref 36.0–46.0)
Hemoglobin: 12.6 g/dL (ref 12.0–15.0)
MCH: 29.5 pg (ref 26.0–34.0)
MCHC: 32.1 g/dL (ref 30.0–36.0)
MCV: 91.8 fL (ref 80.0–100.0)
Platelets: 270 K/uL (ref 150–400)
RBC: 4.27 MIL/uL (ref 3.87–5.11)
RDW: 13.8 % (ref 11.5–15.5)
WBC: 10 K/uL (ref 4.0–10.5)
nRBC: 0 % (ref 0.0–0.2)

## 2024-07-03 LAB — BASIC METABOLIC PANEL WITH GFR
Anion gap: 6 (ref 5–15)
BUN: 16 mg/dL (ref 6–20)
CO2: 27 mmol/L (ref 22–32)
Calcium: 8.9 mg/dL (ref 8.9–10.3)
Chloride: 105 mmol/L (ref 98–111)
Creatinine, Ser: 1.17 mg/dL — ABNORMAL HIGH (ref 0.44–1.00)
GFR, Estimated: 59 mL/min — ABNORMAL LOW (ref >60)
Glucose, Bld: 98 mg/dL (ref 70–99)
Potassium: 4.5 mmol/L (ref 3.5–5.1)
Sodium: 138 mmol/L (ref 135–145)

## 2024-07-03 LAB — TROPONIN I (HIGH SENSITIVITY)
Troponin I (High Sensitivity): 4 ng/L (ref <18)
Troponin I (High Sensitivity): 3 ng/L (ref <18)

## 2024-07-03 MED ORDER — ASPIRIN 325 MG PO TABS
325.0000 mg | ORAL_TABLET | Freq: Once | ORAL | Status: AC
  Administered 2024-07-03: 325 mg via ORAL
  Filled 2024-07-03: qty 1

## 2024-07-03 MED ORDER — ACETAMINOPHEN 325 MG PO TABS
650.0000 mg | ORAL_TABLET | Freq: Once | ORAL | Status: AC
  Administered 2024-07-03: 650 mg via ORAL
  Filled 2024-07-03: qty 2

## 2024-07-03 MED ORDER — LIDOCAINE VISCOUS HCL 2 % MT SOLN
15.0000 mL | Freq: Once | OROMUCOSAL | Status: DC
  Filled 2024-07-03: qty 15

## 2024-07-03 MED ORDER — LIDOCAINE 5 % EX PTCH
1.0000 | MEDICATED_PATCH | Freq: Once | CUTANEOUS | Status: DC
  Administered 2024-07-03: 1 via TRANSDERMAL
  Filled 2024-07-03: qty 1

## 2024-07-03 MED ORDER — IPRATROPIUM-ALBUTEROL 0.5-2.5 (3) MG/3ML IN SOLN
3.0000 mL | Freq: Once | RESPIRATORY_TRACT | Status: AC
  Administered 2024-07-03: 3 mL via RESPIRATORY_TRACT
  Filled 2024-07-03: qty 3

## 2024-07-03 MED ORDER — SODIUM CHLORIDE 0.9 % IV BOLUS
500.0000 mL | Freq: Once | INTRAVENOUS | Status: AC
  Administered 2024-07-03: 500 mL via INTRAVENOUS

## 2024-07-03 MED ORDER — ALUM & MAG HYDROXIDE-SIMETH 200-200-20 MG/5ML PO SUSP
30.0000 mL | Freq: Once | ORAL | Status: DC
  Filled 2024-07-03: qty 30

## 2024-07-03 MED ORDER — KETOROLAC TROMETHAMINE 15 MG/ML IJ SOLN
15.0000 mg | Freq: Once | INTRAMUSCULAR | Status: AC
  Administered 2024-07-03: 15 mg via INTRAVENOUS
  Filled 2024-07-03: qty 1

## 2024-07-03 NOTE — Discharge Instructions (Signed)
You were seen in the Emergency Department today for evaluation of your chest pain. Fortunately, your labs, EKG, and chest x- were overall reassuring against a emergency cause for your pain. Please follow-up with your primary doctor within the next few days for reevaluation. You can take Tylenol and ibuprofen as needed for your pain, unless there is another reason that you should not take these. Return to the ER for any new or worsening symptoms including worsening chest pain, difficulty breathing, or any other new or concerning symptoms that you believe warrants immediate attention.   

## 2024-07-03 NOTE — ED Provider Notes (Signed)
 [facility] Provider Note    Event Date/Time   First MD Initiated Contact with Patient 07/03/24 1031     (approximate)   History   Chest Pain   HPI  Brianna [name] is a 43 year old female with history of hypertension, asthma presenting to the emergency department for evaluation of chest pain.  Patient reports she is passing through town on her way to the beach.  Last night she had onset of left-sided chest pain with radiation to her left arm.  Reports a history of angina with multiple similar episodes in the past.  Additionally reports some shortness of breath.  On review of her chart, a note frequent visits related to chest pain at multiple hospitals with frequent visits at the [place] [facility] this year.  Does appear that workup has largely been reassuring during these visits.  Patient reports she had a stress test done less than a year ago.    Physical Exam   Triage Vital Signs: ED Triage Vitals  Encounter Vitals Group     BP 07/03/24 0955 129/87     Girls Systolic BP Percentile --      Girls Diastolic BP Percentile --      Boys Systolic BP Percentile --      Boys Diastolic BP Percentile --      Pulse Rate 07/03/24 0954 (!) 52     Resp 07/03/24 0954 18     Temp 07/03/24 0954 98.4 F (36.9 C)     Temp Source 07/03/24 0954 Oral     SpO2 07/03/24 0954 96 %     Weight 07/03/24 0951 197 lb (89.4 kg)     Height 07/03/24 0951 4' 11 (1.499 m)     Head Circumference --      Peak Flow --      Pain Score 07/03/24 0951 8     Pain Loc --      Pain Education --      Exclude from Growth Chart --     Most recent vital signs: Vitals:   07/03/24 0954 07/03/24 0955  BP:  129/87  Pulse: (!) 52   Resp: 18   Temp: 98.4 F (36.9 C)   SpO2: 96% 98%     General: Awake, interactive  CV:  Bradycardic with regular rhythm, good peripheral perfusion Resp:  Unlabored respirations, lung sounds mildly tight with rare wheeze Abd:  Nondistended Neuro:  Symmetric  facial movement, fluid speech   ED Results / Procedures / Treatments   Labs (all labs ordered are listed, but only abnormal results are displayed) Labs Reviewed  BASIC METABOLIC PANEL WITH GFR - Abnormal; Notable for the following components:      Result Value   Creatinine, Ser 1.17 (*)    GFR, Estimated 59 (*)    All other components within normal limits  CBC  TROPONIN I (HIGH SENSITIVITY)  TROPONIN I (HIGH SENSITIVITY)     EKG EKG independently reviewed and interpreted by myself demonstrates:  EKG demonstrates sinus bradycardia at a rate of 58, PR 128, cures 76, QTc 412, no acute ST changes  RADIOLOGY Imaging independently reviewed and interpreted by myself demonstrates:  CXR without focal consolidation  Formal Radiology Read:  DG Chest 2 View Result Date: 07/03/2024 CLINICAL DATA:  Chest pain. EXAM: CHEST - 2 VIEW COMPARISON:  Chest radiograph dated 07/01/2024. FINDINGS: The heart size and mediastinal contours are within normal limits. Both lungs are clear. The visualized skeletal structures are unremarkable. IMPRESSION:  No active cardiopulmonary disease. Electronically Signed   By: Vanetta Chou M.D.   On: 07/03/2024 10:21    PROCEDURES:  Critical Care performed: No  Procedures   MEDICATIONS ORDERED IN ED: Medications  alum & mag hydroxide-simeth (MAALOX/MYLANTA) 200-200-20 MG/5ML suspension 30 mL (30 mLs Oral Not Given 07/03/24 1256)  lidocaine  (XYLOCAINE ) 2 % viscous mouth solution 15 mL (15 mLs Mouth/Throat Not Given 07/03/24 1256)  lidocaine  (LIDODERM ) 5 % 1 patch (1 patch Transdermal Patch Applied 07/03/24 1256)  ketorolac  (TORADOL ) 15 MG/ML injection 15 mg (15 mg Intravenous Given 07/03/24 1101)  ipratropium-albuterol  (DUONEB) 0.5-2.5 (3) MG/3ML nebulizer solution 3 mL (3 mLs Nebulization Given 07/03/24 1101)  aspirin  tablet 325 mg (325 mg Oral Given 07/03/24 1200)  sodium chloride  0.9 % bolus 500 mL (500 mLs Intravenous New Bag/Given 07/03/24 1201)   acetaminophen  (TYLENOL ) tablet 650 mg (650 mg Oral Given 07/03/24 1256)     IMPRESSION / MDM / ASSESSMENT AND PLAN / ED COURSE  I reviewed the triage vital signs and the nursing notes.  Differential diagnosis includes, but is not limited to ACS, pneumonia, pneumothorax, lower suspicion PE or aortic dissection, PERC negative, musculoskeletal strain, stress-mediated physiologic response  Patient's presentation is most consistent with acute presentation with potential threat to life or bodily function.  Presents with chest pain. Labs, EKG, CXR reassuring.  Will obtain repeat troponin given reported history of ACS.  If reassuring, suspect patient will be stable for discharge.  Repeat troponin returned within normal limits.  Patient reassessed.  Discussed alternative etiologies of her pain given lower suspicion for cardiac pain.  I did discuss consideration for reflux as documented noted in documentation from outside hospital from her pulmonologist. I discussed the need for continued outpatient follow-up. Strict return precautions were provided. She was discharged in stable condition.          FINAL CLINICAL IMPRESSION(S) / ED DIAGNOSES   Final diagnoses:  Nonspecific chest pain     Rx / DC Orders   ED Discharge Orders     None        Note:  This document was prepared using Dragon voice recognition software and may include unintentional dictation errors.   Levander Slate, MD 07/03/24 [phone]

## 2024-07-03 NOTE — ED Triage Notes (Signed)
 Pt via POV from home. Pt c/o L sided CP that radiates to the L arm that started last night. Reports hx of angina but rest did not relieve her pain. Pt also c/o SOB and some nausea. Denies blood thinners. Pt is A&OX4 and NAD, ambulatory to triage.
